# Patient Record
Sex: Male | Born: 1955
Health system: Southern US, Community
[De-identification: ages and names within clinical notes are randomized; demographics above are authoritative.]

## PROBLEM LIST (undated history)

## (undated) DIAGNOSIS — I10 Essential (primary) hypertension: Secondary | ICD-10-CM

## (undated) DIAGNOSIS — N401 Enlarged prostate with lower urinary tract symptoms: Secondary | ICD-10-CM

## (undated) DIAGNOSIS — M48061 Spinal stenosis, lumbar region without neurogenic claudication: Secondary | ICD-10-CM

## (undated) DIAGNOSIS — R202 Paresthesia of skin: Secondary | ICD-10-CM

## (undated) DIAGNOSIS — F329 Major depressive disorder, single episode, unspecified: Secondary | ICD-10-CM

## (undated) DIAGNOSIS — E785 Hyperlipidemia, unspecified: Secondary | ICD-10-CM

## (undated) DIAGNOSIS — M542 Cervicalgia: Secondary | ICD-10-CM

## (undated) DIAGNOSIS — K219 Gastro-esophageal reflux disease without esophagitis: Secondary | ICD-10-CM

## (undated) DIAGNOSIS — F419 Anxiety disorder, unspecified: Secondary | ICD-10-CM

## (undated) DIAGNOSIS — I2510 Atherosclerotic heart disease of native coronary artery without angina pectoris: Secondary | ICD-10-CM

## (undated) DIAGNOSIS — R062 Wheezing: Secondary | ICD-10-CM

## (undated) DIAGNOSIS — M79601 Pain in right arm: Secondary | ICD-10-CM

## (undated) DIAGNOSIS — I25118 Atherosclerotic heart disease of native coronary artery with other forms of angina pectoris: Secondary | ICD-10-CM

## (undated) DIAGNOSIS — Z95820 Peripheral vascular angioplasty status with implants and grafts: Secondary | ICD-10-CM

## (undated) DIAGNOSIS — M79602 Pain in left arm: Secondary | ICD-10-CM

## (undated) DIAGNOSIS — Z87442 Personal history of urinary calculi: Secondary | ICD-10-CM

## (undated) DIAGNOSIS — R079 Chest pain, unspecified: Secondary | ICD-10-CM

## (undated) DIAGNOSIS — E119 Type 2 diabetes mellitus without complications: Secondary | ICD-10-CM

## (undated) HISTORY — PX: HERNIA REPAIR: SHX51

## (undated) HISTORY — DX: Gastro-esophageal reflux disease without esophagitis: K21.9

## (undated) HISTORY — DX: Anxiety disorder, unspecified: F41.9

## (undated) HISTORY — DX: Type 2 diabetes mellitus without complications: E11.9

## (undated) HISTORY — DX: Spinal stenosis, lumbar region without neurogenic claudication: M48.061

## (undated) HISTORY — DX: Chest pain, unspecified: R07.9

## (undated) HISTORY — DX: Major depressive disorder, single episode, unspecified: F32.9

## (undated) HISTORY — DX: Hyperlipidemia, unspecified: E78.5

## (undated) HISTORY — DX: Paresthesia of skin: R20.2

## (undated) HISTORY — DX: Atherosclerotic heart disease of native coronary artery with other forms of angina pectoris: I25.118

## (undated) HISTORY — DX: Wheezing: R06.2

## (undated) HISTORY — PX: BACK SURGERY: SHX140

## (undated) HISTORY — DX: Essential (primary) hypertension: I10

## (undated) HISTORY — DX: Cervicalgia: M54.2

## (undated) HISTORY — DX: Benign prostatic hyperplasia with lower urinary tract symptoms: N40.1

## (undated) HISTORY — DX: Pain in right arm: M79.601

## (undated) HISTORY — PX: LITHOTRIPSY: SUR834

## (undated) HISTORY — DX: Pain in left arm: M79.602

---

## 1998-02-20 ENCOUNTER — Inpatient Hospital Stay (HOSPITAL_COMMUNITY): Admission: EM | Admit: 1998-02-20 | Discharge: 1998-02-21 | Payer: Self-pay | Admitting: *Deleted

## 1998-02-26 ENCOUNTER — Ambulatory Visit (HOSPITAL_COMMUNITY): Admission: RE | Admit: 1998-02-26 | Discharge: 1998-02-26 | Payer: Self-pay | Admitting: Cardiology

## 2005-03-01 ENCOUNTER — Ambulatory Visit: Payer: Self-pay | Admitting: Family Medicine

## 2005-09-22 ENCOUNTER — Ambulatory Visit: Payer: Self-pay | Admitting: Family Medicine

## 2005-12-20 ENCOUNTER — Ambulatory Visit: Payer: Self-pay | Admitting: Family Medicine

## 2006-01-12 ENCOUNTER — Ambulatory Visit: Payer: Self-pay | Admitting: Family Medicine

## 2010-11-09 ENCOUNTER — Encounter
Admission: RE | Admit: 2010-11-09 | Discharge: 2010-11-09 | Payer: Self-pay | Source: Home / Self Care | Attending: Orthopedic Surgery | Admitting: Orthopedic Surgery

## 2010-12-27 ENCOUNTER — Ambulatory Visit (HOSPITAL_COMMUNITY)
Admission: RE | Admit: 2010-12-27 | Discharge: 2010-12-27 | Disposition: A | Discharge status: Home / Self Care | Payer: Worker's Compensation | Source: Ambulatory Visit | Attending: Orthopedic Surgery | Admitting: Orthopedic Surgery

## 2010-12-27 ENCOUNTER — Other Ambulatory Visit: Payer: Self-pay | Admitting: Orthopedic Surgery

## 2010-12-27 ENCOUNTER — Other Ambulatory Visit (HOSPITAL_COMMUNITY): Payer: Self-pay | Admitting: Orthopedic Surgery

## 2010-12-27 ENCOUNTER — Encounter (HOSPITAL_COMMUNITY): Payer: Worker's Compensation

## 2010-12-27 DIAGNOSIS — Z01818 Encounter for other preprocedural examination: Secondary | ICD-10-CM | POA: Insufficient documentation

## 2010-12-27 DIAGNOSIS — M545 Low back pain, unspecified: Secondary | ICD-10-CM | POA: Insufficient documentation

## 2010-12-27 LAB — DIFFERENTIAL
Basophils Absolute: 0.1 10*3/uL (ref 0.0–0.1)
Basophils Relative: 1 % (ref 0–1)
Neutro Abs: 3.5 10*3/uL (ref 1.7–7.7)
Neutrophils Relative %: 50 % (ref 43–77)

## 2010-12-27 LAB — CBC
Hemoglobin: 13.3 g/dL (ref 13.0–17.0)
RBC: 4.44 MIL/uL (ref 4.22–5.81)
WBC: 6.8 10*3/uL (ref 4.0–10.5)

## 2010-12-27 LAB — URINALYSIS, ROUTINE W REFLEX MICROSCOPIC
Bilirubin Urine: NEGATIVE
Nitrite: NEGATIVE
Protein, ur: NEGATIVE mg/dL
Urine Glucose, Fasting: NEGATIVE mg/dL
Urobilinogen, UA: 0.2 mg/dL (ref 0.0–1.0)
pH: 5.5 (ref 5.0–8.0)

## 2010-12-27 LAB — COMPREHENSIVE METABOLIC PANEL
ALT: 46 U/L (ref 0–53)
AST: 42 U/L — ABNORMAL HIGH (ref 0–37)
Calcium: 9.6 mg/dL (ref 8.4–10.5)
GFR calc Af Amer: >60 mL/min (ref >60)
Glucose, Bld: 109 mg/dL — ABNORMAL HIGH (ref 70–99)
Sodium: 139 mEq/L (ref 135–145)
Total Protein: 7.4 g/dL (ref 6.0–8.3)

## 2010-12-27 LAB — PROTIME-INR
INR: 0.96 (ref 0.00–1.49)
Prothrombin Time: 13 seconds (ref 11.6–15.2)

## 2010-12-27 LAB — SURGICAL PCR SCREEN
MRSA, PCR: NEGATIVE
Staphylococcus aureus: NEGATIVE

## 2010-12-27 LAB — URINE MICROSCOPIC-ADD ON

## 2010-12-27 LAB — ABO/RH: ABO/RH(D): O NEG

## 2010-12-27 LAB — TYPE AND SCREEN: Antibody Screen: NEGATIVE

## 2010-12-29 ENCOUNTER — Inpatient Hospital Stay (HOSPITAL_COMMUNITY)
Admission: RE | Admit: 2010-12-29 | Discharge: 2011-01-01 | DRG: 460 | Disposition: A | Discharge status: Home / Self Care | Payer: Worker's Compensation | Source: Ambulatory Visit | Attending: Orthopedic Surgery | Admitting: Orthopedic Surgery

## 2010-12-29 ENCOUNTER — Inpatient Hospital Stay (HOSPITAL_COMMUNITY): Payer: Worker's Compensation

## 2010-12-29 DIAGNOSIS — M5137 Other intervertebral disc degeneration, lumbosacral region: Principal | ICD-10-CM | POA: Diagnosis present

## 2010-12-29 DIAGNOSIS — M51379 Other intervertebral disc degeneration, lumbosacral region without mention of lumbar back pain or lower extremity pain: Principal | ICD-10-CM | POA: Diagnosis present

## 2010-12-29 DIAGNOSIS — M5126 Other intervertebral disc displacement, lumbar region: Secondary | ICD-10-CM | POA: Diagnosis present

## 2010-12-30 LAB — CARDIAC PANEL(CRET KIN+CKTOT+MB+TROPI)
CK, MB: 26.5 ng/mL (ref 0.3–4.0)
Relative Index: 0.2 (ref 0.0–2.5)
Total CK: 18309 U/L — ABNORMAL HIGH (ref 7–232)
Troponin I: 0.02 ng/mL (ref 0.00–0.06)

## 2010-12-30 LAB — POCT I-STAT 4, (NA,K, GLUC, HGB,HCT)
Potassium: 4.9 mEq/L (ref 3.5–5.1)
Sodium: 137 mEq/L (ref 135–145)

## 2010-12-30 LAB — CBC
HCT: 33 % — ABNORMAL LOW (ref 39.0–52.0)
Hemoglobin: 11.1 g/dL — ABNORMAL LOW (ref 13.0–17.0)
MCH: 30.7 pg (ref 26.0–34.0)
MCHC: 33.6 g/dL (ref 30.0–36.0)
MCV: 91.4 fL (ref 78.0–100.0)

## 2011-01-02 NOTE — Op Note (Addendum)
NAMELUMAN, HOLWAY NO.:  [id number]  MEDICAL RECORD NO.:  [id number]           PATIENT TYPE:  LOCATION:                                 FACILITY:  PHYSICIAN:  Estill Bamberg, MD      DATE OF BIRTH:  Dec 17, 1955  DATE OF PROCEDURE:  12/29/2010 DATE OF DISCHARGE:                              OPERATIVE REPORT   PREOPERATIVE DIAGNOSES: 1. Degenerative disk disease L4-5, L5-S1. 2. Right-sided S1 radiculopathy. 3. Right-sided L5-S1 herniated nucleus pulposus.  POSTOPERATIVE DIAGNOSES: 1. Degenerative disk disease L4-5, L5-S1. 2. Right-sided S1 radiculopathy. 3. Right-sided L5-S1 herniated nucleus pulposus.  PROCEDURE: 1. Posterolateral lumbar fusion L4-5, L5-S1. 2. Transforaminal lumbar interbody fusion L4-5, L5-S1. 3. Placement of posterior segmental instrumentation L4, L5, S1. 4. Insertion of intervertebral spacer L4-5, L5-S1 (11-mm Concorde     curved parallel cage at L4-5, 10-mm Concorde bullet cage L5-S1). 5. Use of local autograft. 6. Intraoperative bone marrow aspiration. 7. Gill type laminectomy L4-5, L5-S1. 8. Intraoperative use of fluoroscopy.  SURGEON:  Estill Bamberg, MD  ASSISTANT:  Dan Humphreys, [facility]  ANESTHESIA:  General endotracheal anesthesia.  ESTIMATED BLOOD LOSS:  1100 mL with 400 mL of Cell Saver blood autotransfused.  COMPLICATIONS:  None.  DISPOSITION:  Stable.  INDICATIONS FOR PROCEDURE:  Briefly, Mr. Olivero is a very pleasant 55- year-old male who I initially saw in my office on October 05, 2010 with low back and right leg pain.  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  He immediately went onto note pain in his low back.  He then went onto have pain in his right leg as well as increasing pain in his low back.  We did go forward with extensive conservative treatment measures including anti-inflammatory medications, pain  medications, physical therapy in addition to steroids.  The patient, however, did go onto have severe debilitating pain in his low back.  A diskogram was obtained and was consistent with concordant pain at the L4-5 and L5-S1 levels.  These were the levels which did correlate to his MRI findings which did reveal degenerative disk disease at the levels mentioned above.  The patient did understand the risks and limitations of surgery.  Specifically, the patient understand there is a 70% chance of alleviation of his pain.  After fully understanding the risks and limitations of surgery, the patient did agree to go forward with the procedure outlined above.  OPERATIVE DETAILS:  On December 29, 2010, the patient was brought to surgery and general endotracheal anesthesia was administered.  The patient was placed prone on a well-padded Jackson spinal frame.  The back was prepped and draped in the usual sterile fashion.  SCDs were placed and a time-out procedure was performed.  Ancef was given for antibiotic prophylaxis.  A lateral preoperative radiograph with 18-gauge spinal needles into the back did help optimize the location of my incision.  I then made a midline incision from approximately the spinous process of L3 to approximately spinous process of S1.  I  did readily identified the fascia and the fascia was incised at the midline.  The paraspinal musculature was swept laterally and self-retaining retractors were placed.  The lamina of L4, L5, and S1 were subperiosteally exposed. At this point, I did bring in the C-arm and did obtain a lateral intraoperative fluoroscopic view in order to help confirm the proper levels.  I then exposed the transverse processes of L4 and L5 and sacral ala of S1 both the right and left sides.  I then placed screws into the L4, L5, and S1 pedicles in the usual manner.  The screws were placed using a 4-mm bur followed by a curved gearshift probe.  A 6-mm tap  was used at L4 and 7-mm tap was used at L5 and S1.  A ball-tip probe did confirm there was no cortical violation.  Bone wax was used to seal the holes.  This was done on both the right and left sides.  I then packed off the left side and turned my attention towards the right side.  I did perform a right-sided Gill type laminectomy on the right side removing the inferior articular process of L4.  I then removed the superior articular process of L5 and ligamentum flavum was removed uneventfully. I did safely retract the traversing L5 nerve medially and the exiting L4 nerve superiorly.  At this point, I did readily identify the intervertebral space.  The posterolateral aspect of the disk was incised using a 15 blade scalpel.  I then used a series of pituitaries, curettes, and Kerrison's in order to perform a thorough diskectomy at the L4-5 level.  I then went forward with a series of trials and I did feel that 11-mm Concorde curved cage would be the appropriate fit.  At this point I went forward with bone marrow aspiration.  I used a #15 blade knife to make a stab incision over the left iliac crest.  10 mL of bone marrow aspirate was obtained and was mixed with 10 mL pack of Vitoss BA.  At this point, I did pack the intervertebral space with an extensive amount of autologous bone that was obtained from the decompression.  I then filled the intervertebral cage with autologous bone and Vitoss.  The cage was tamped into position in the usual fashion.  I did note an excellent press fit.  I did use AP and lateral fluoroscopic views to help optimize location of the intervertebral cage. I then turned my attention towards the L5-S1 intervertebral space.  A diskectomy was performed in the manner previously described.  It should be noted that access to the L5-S1 intervertebral space was extremely difficult given the patient's excessive lordosis.  The L5 nerve, however, was gently retracted superiorly  in order to help gain access to the intervertebral space but again, it was extremely difficult to gain access to the space given the patient's lordosis.  I was, however, able to place a 10-mm Concorde bullet cage trial and I did feel that this trial was to be the appropriate fit.  The intervertebral space was again packed with autograft bone and the intervertebral cage was also packed with allograft bone in addition to Vitoss.  The cage was tamped into position in the usual fashion.  At this point, I did place the L4, the L5, and S1 screws on the right side.  On the right side, 7 x 45 mm screws were placed at L4, and 8 x 45-mm screw was placed at L5 and an 8  x 45 mm screw was placed at S1.  Of note, the S1 screws were placed under live fluoroscopy in order to help optimize trajectory of the screw.  An appropriately sized 5.5-mm rod was placed in the screw heads and caps were placed.  Just prior to final tightening procedure, I did slightly compress and crossed the intervertebral spaces at L4-5 and L5- S1.  Of note, I did perform motor evoked EMG potentials with a neurologic monitoring team and there was no motor evoked EMG potential less than 25 mA.  Again, I did obtain AP and lateral views in order to confirm the appropriate location of the screws.  At this point, I copiously irrigated the wound with approximately 1 liter of normal saline and I then turned my attention towards the patient's left side. I did go forward with a posterolateral fusion.  To do this, I did use a 4-mm high-speed bur and I did decorticate the posterior elements of L4, L5, and S1 and I did decorticate the transverse process of L4 and L5 and the sacral ala of S1.  An additional 10 mL of Vitoss BA was obtained and bone marrow aspirate was again obtained.  The remainder of the autograft was mixed with the Vitoss BA and bone marrow aspirate mixture.  This was packed onto the posterior elements and across the  posterolateral gutters.  The L4, the L5, and S1 screws were placed in the manner described previously.  The same size as previously described were used with the exception of the L4 screw on the left side, in which a 7 x 40 mm screw was placed.  I did again perform motor evoked EMG potentials at each screw and there was no motor evoked EMG potential less than 25. The appropriately sized rod was placed and caps were then placed and final tightened into position uneventfully.  Again, AP and lateral views were obtained and I was very happy with the appearance of the final construct.  Of note, all epidural bleeding was meticulously controlled. A Blake drain was placed across the right posterior elements.  Of note, there was no extravasation of cerebrospinal fluid encountered throughout the procedure.  There was a very small area of attenuation on the right side at the level of L5-S1 interspace.  I therefore did place a small patch of Duragen and this was reinforced with DuraSeal.  Again, no extravasation of cerebrospinal fluid was encountered.  I then closed the fascia using #1 Vicryl.  Subcutaneous layer was closed using 2-0 Vicryl and the skin was closed using 3-0 Monocryl.  All instrument counts were correct at the termination of the procedure.  Of note, Skip Mayer, PA-C, was my assistant throughout the procedure and aided in retraction and suctioning that was required throughout the procedure.     Estill Bamberg, MD     MD/MEDQ  D:  12/29/2010  T:  12/30/2010  Job:  [id number]  Electronically Signed by Loraine Leriche Asalee Barrette  on 12/31/2010 08:18:25 AM

## 2011-02-09 NOTE — Discharge Summary (Signed)
  NAMELANGLEY, INGALLS               ACCOUNT NO.:  [id number]  MEDICAL RECORD NO.:  [id number]           PATIENT TYPE:  I  LOCATION:  5037                         FACILITY:  [facility]  PHYSICIAN:  Estill Bamberg, MD      DATE OF BIRTH:  1956/02/23  DATE OF ADMISSION:  12/29/2010 DATE OF DISCHARGE:  01/01/2011                              DISCHARGE SUMMARY   ADMISSION DIAGNOSES: 1. Degenerative disk disease L4-5, L5-S1. 2. Right-sided S1 radiculopathy. 3. Right-sided L5-S1 disk herniation.  DISCHARGE DIAGNOSES: 1. Degenerative disk disease L4-5, L5-S1. 2. Right-sided S1 radiculopathy. 3. Right-sided L5-S1 disk herniation. 4. Status post anterior/posterior lumbar fusion L4-5, L5-S1 with     instrumentation.  ADMISSION HISTORY:  Briefly, Mr. Mangino is a pleasant 55 year old male who I evaluated in my office with signs and symptoms related to low back and right leg pain.  Of note, the patient was status post a work-related injury dated June 09, 2010.  The patient did go forward with extensive conservative treatment but continued to have debilitating pain.  The diskogram was significant for concordant pain at the L4-5 and L5-S1 levels, and therefore, the patient was admitted on December 29, 2010, to the hospital for operative intervention of his degenerative disk disease.  HOSPITAL COURSE:  On December 23, 2010, the patient was admitted for the procedure outlined above.  The patient tolerated the procedure well and was transferred to recovery in stable condition.  Initially, a Foley catheter was placed.  A PCA was started immediately after surgery.  On postop day #1, his Foley catheter was removed, as was his PCA.  The patient did get physical therapy throughout his hospital stay.  On postop day #3, it was felt by the physical therapy team that the patient will be safe for discharge home.  Of note, the patient's neurovascular exam was unremarkable and normal throughout his hospital stay.   His wound was noted to be clean, dry, and intact, and the patient was uneventfully discharged on postop day #3.  DISCHARGE INSTRUCTIONS:  The patient will take Vicodin for pain and Valium for muscle spasms.  He was not educated on back precautions.  The patient will follow up with me in approximately 2 weeks after his procedure.     Estill Bamberg, MD     MD/MEDQ  D:  01/27/2011  T:  01/28/2011  Job:  [id number]  Electronically Signed by Loraine Leriche Arabelle Bollig  on 02/09/2011 08:29:27 AM

## 2011-09-14 ENCOUNTER — Ambulatory Visit (HOSPITAL_COMMUNITY)
Admission: RE | Admit: 2011-09-14 | Discharge: 2011-09-14 | Disposition: A | Discharge status: Home / Self Care | Payer: Worker's Compensation | Source: Ambulatory Visit | Attending: Orthopedic Surgery | Admitting: Orthopedic Surgery

## 2011-09-14 ENCOUNTER — Ambulatory Visit (HOSPITAL_COMMUNITY): Payer: Worker's Compensation

## 2011-09-14 DIAGNOSIS — E119 Type 2 diabetes mellitus without complications: Secondary | ICD-10-CM | POA: Insufficient documentation

## 2011-09-14 DIAGNOSIS — I10 Essential (primary) hypertension: Secondary | ICD-10-CM | POA: Insufficient documentation

## 2011-09-14 DIAGNOSIS — M533 Sacrococcygeal disorders, not elsewhere classified: Secondary | ICD-10-CM | POA: Insufficient documentation

## 2011-09-14 DIAGNOSIS — K219 Gastro-esophageal reflux disease without esophagitis: Secondary | ICD-10-CM | POA: Insufficient documentation

## 2011-09-14 LAB — BASIC METABOLIC PANEL
CO2: 25 mEq/L (ref 19–32)
Chloride: 102 mEq/L (ref 96–112)
Glucose, Bld: 112 mg/dL — ABNORMAL HIGH (ref 70–99)
Sodium: 140 mEq/L (ref 135–145)

## 2011-09-14 LAB — CBC
Hemoglobin: 13.4 g/dL (ref 13.0–17.0)
MCV: 89.2 fL (ref 78.0–100.0)
Platelets: 227 10*3/uL (ref 150–400)
RBC: 4.44 MIL/uL (ref 4.22–5.81)
WBC: 7.4 10*3/uL (ref 4.0–10.5)

## 2011-09-14 LAB — DIFFERENTIAL
Basophils Relative: 0 % (ref 0–1)
Eosinophils Absolute: 0.1 10*3/uL (ref 0.0–0.7)
Lymphs Abs: 2.7 10*3/uL (ref 0.7–4.0)
Neutro Abs: 3.9 10*3/uL (ref 1.7–7.7)
Neutrophils Relative %: 53 % (ref 43–77)

## 2011-09-14 LAB — GLUCOSE, CAPILLARY
Glucose-Capillary: 111 mg/dL — ABNORMAL HIGH (ref 70–99)
Glucose-Capillary: 111 mg/dL — ABNORMAL HIGH (ref 70–99)

## 2011-09-14 LAB — TYPE AND SCREEN
ABO/RH(D): O NEG
Antibody Screen: NEGATIVE

## 2011-09-14 LAB — SURGICAL PCR SCREEN: MRSA, PCR: NEGATIVE

## 2011-09-16 NOTE — Op Note (Signed)
Nicholas Joseph, Nicholas Joseph NO.:  [id number]  MEDICAL RECORD NO.:  [id number]  LOCATION:  [facility]                         FACILITY:  [facility]  PHYSICIAN:  Estill Bamberg, MD      DATE OF BIRTH:  11-Jan-1956  DATE OF PROCEDURE:  09/14/2011 DATE OF DISCHARGE:  09/14/2011                              OPERATIVE REPORT   PREOPERATIVE DIAGNOSIS:  Right sacroiliac joint dysfunction.  POSTOP DIAGNOSIS:  Right sacroiliac joint dysfunction.  PROCEDURE:  Right-sided sacroiliac joint fusion.  SURGEON:  Estill Bamberg, MD  ASSISTANT:  Nicholas Joseph, OPA  ANESTHESIA:  General endotracheal anesthesia.  COMPLICATIONS:  None.  DISPOSITION:  Stable.  ESTIMATED BLOOD LOSS:  Minimal.  IMPLANTS USED:  SI bone titanium implants, 7 mm in diameter (55, 40, 35 mm).  INDICATIONS FOR PROCEDURE:  Briefly, Nicholas Joseph is a very pleasant 55- year-old male who has been followed by me for various issues related to his lower back.  He did most recently go on to have pain at the right side of his low back.  I did feel this was secondary to sacroiliac joint dysfunction.  I did refer the patient to Dr. Modesto Charon for right-sided sacroiliac joint injection.  The patient had a total of 2 injections. On the first injection, the patient states that his pain was entirely alleviated, but the relief was only temporary.  He did not get significant improvement after second injection but he is clear in stating that this first injection did entirely alleviate his pain.  We did go forward other conservative measures as well.  Given his failure of conservative care and excellent response from his first SI injection, we did have discussion regarding going forward with the right-sided sacroiliac joint fusion.  The patient fully understood the risks and limitations of the procedure as outlined in my preoperative note.  OPERATIVE DETAILS:  On September 14, 2011, the patient was brought to the surgery and general  endotracheal anesthesia was administered.  The patient was placed prone on a well-padded flat Jackson table with gel rolls placed under the patient's chest and hips.  SCDs were placed and antibiotics were given.  The right buttock was prepped and draped in the usual sterile fashion.  A time-out procedure was performed.  I then brought in lateral fluoroscopy.  I did Marysue Fait out the sacral ala and the posterior border of the sacrum.  A 3 cm incision was made 1 cm posterior to the posterior border of the sacrum just beneath the sacral ala.  I then placed my first guidewire.  The guidewire was placed above the S1 foramen and clearly within the vertebral body as noted on inlet and outlet radiographs.  I did liberally use lateral inlet and outlet radiographs during advancement of the guidewire.  I then used a cannulated drill to drill across the sacroiliac joint.  I then used the broach and I placed a 55 mm titanium implant across the sacroiliac joint.  I did note an excellent press fit.  I then placed a second guidewire just beneath the first.  Again, liberal fluoroscopy was used to ensure the guidewires in the appropriate position.  The second guidewire was aimed towards  the neural foramen on the right side.  I then used the broach and a tamp in the manner described previously.  I then selected a 40 mm titanium implant and this was advanced across the sacroiliac joint in the same way the first was.  I did note an excellent press fit.  A 30 implant was placed below the second in the manner described previously.  A 35 mm implant was used at the inferior aspect of the sacroiliac joint.  I then again obtained AP lateral inlet and outlet views and was very happy with the final appearance of the radiographs.  The wound was then copiously irrigated.  The fascia was then closed using 0 Vicryl.  The subcutaneous layer was closed using 2-0 Vicryl, and the skin was closed using 3-0 Monocryl.  All  instrument counts were correct at the termination of the procedure.  Of note, Nicholas Joseph, was my assistant throughout the procedure and aided in essential suctioning and placement of the hardware.     Estill Bamberg, MD     MD/MEDQ  D:  09/14/2011  T:  09/15/2011  Job:  [id number]  Electronically Signed by Estill Bamberg  on 09/16/2011 05:33:53 PM

## 2015-11-24 DIAGNOSIS — N529 Male erectile dysfunction, unspecified: Secondary | ICD-10-CM | POA: Diagnosis not present

## 2015-11-24 DIAGNOSIS — J069 Acute upper respiratory infection, unspecified: Secondary | ICD-10-CM | POA: Diagnosis not present

## 2015-11-24 DIAGNOSIS — F339 Major depressive disorder, recurrent, unspecified: Secondary | ICD-10-CM | POA: Diagnosis not present

## 2016-01-20 DIAGNOSIS — L03031 Cellulitis of right toe: Secondary | ICD-10-CM | POA: Diagnosis not present

## 2016-01-22 DIAGNOSIS — N401 Enlarged prostate with lower urinary tract symptoms: Secondary | ICD-10-CM | POA: Insufficient documentation

## 2016-01-22 DIAGNOSIS — F419 Anxiety disorder, unspecified: Secondary | ICD-10-CM

## 2016-01-22 DIAGNOSIS — E785 Hyperlipidemia, unspecified: Secondary | ICD-10-CM

## 2016-01-22 DIAGNOSIS — Z794 Long term (current) use of insulin: Secondary | ICD-10-CM | POA: Insufficient documentation

## 2016-01-22 DIAGNOSIS — I10 Essential (primary) hypertension: Secondary | ICD-10-CM | POA: Insufficient documentation

## 2016-01-22 DIAGNOSIS — F329 Major depressive disorder, single episode, unspecified: Secondary | ICD-10-CM | POA: Insufficient documentation

## 2016-01-22 DIAGNOSIS — F32A Depression, unspecified: Secondary | ICD-10-CM | POA: Insufficient documentation

## 2016-01-22 DIAGNOSIS — E1165 Type 2 diabetes mellitus with hyperglycemia: Secondary | ICD-10-CM

## 2016-01-22 DIAGNOSIS — K219 Gastro-esophageal reflux disease without esophagitis: Secondary | ICD-10-CM | POA: Insufficient documentation

## 2016-01-22 DIAGNOSIS — M48061 Spinal stenosis, lumbar region without neurogenic claudication: Secondary | ICD-10-CM

## 2016-01-22 DIAGNOSIS — E119 Type 2 diabetes mellitus without complications: Secondary | ICD-10-CM

## 2016-01-22 HISTORY — DX: Gastro-esophageal reflux disease without esophagitis: K21.9

## 2016-01-22 HISTORY — DX: Type 2 diabetes mellitus without complications: E11.9

## 2016-01-22 HISTORY — DX: Depression, unspecified: F32.A

## 2016-01-22 HISTORY — DX: Essential (primary) hypertension: I10

## 2016-01-22 HISTORY — DX: Hyperlipidemia, unspecified: E78.5

## 2016-01-22 HISTORY — DX: Anxiety disorder, unspecified: F41.9

## 2016-01-22 HISTORY — DX: Spinal stenosis, lumbar region without neurogenic claudication: M48.061

## 2016-01-22 HISTORY — DX: Type 2 diabetes mellitus with hyperglycemia: E11.65

## 2016-01-22 HISTORY — DX: Benign prostatic hyperplasia with lower urinary tract symptoms: N40.1

## 2016-03-07 DIAGNOSIS — E1169 Type 2 diabetes mellitus with other specified complication: Secondary | ICD-10-CM | POA: Diagnosis not present

## 2016-03-07 DIAGNOSIS — E782 Mixed hyperlipidemia: Secondary | ICD-10-CM | POA: Diagnosis not present

## 2016-03-07 DIAGNOSIS — I10 Essential (primary) hypertension: Secondary | ICD-10-CM | POA: Diagnosis not present

## 2016-03-07 DIAGNOSIS — Z1329 Encounter for screening for other suspected endocrine disorder: Secondary | ICD-10-CM | POA: Diagnosis not present

## 2016-03-08 DIAGNOSIS — E1169 Type 2 diabetes mellitus with other specified complication: Secondary | ICD-10-CM | POA: Diagnosis not present

## 2016-03-09 DIAGNOSIS — E119 Type 2 diabetes mellitus without complications: Secondary | ICD-10-CM | POA: Diagnosis not present

## 2016-03-09 DIAGNOSIS — M4806 Spinal stenosis, lumbar region: Secondary | ICD-10-CM | POA: Diagnosis not present

## 2016-03-09 DIAGNOSIS — N401 Enlarged prostate with lower urinary tract symptoms: Secondary | ICD-10-CM | POA: Diagnosis not present

## 2016-03-09 DIAGNOSIS — F419 Anxiety disorder, unspecified: Secondary | ICD-10-CM | POA: Diagnosis not present

## 2016-03-09 DIAGNOSIS — E785 Hyperlipidemia, unspecified: Secondary | ICD-10-CM | POA: Diagnosis not present

## 2016-03-09 DIAGNOSIS — I10 Essential (primary) hypertension: Secondary | ICD-10-CM | POA: Diagnosis not present

## 2016-03-09 DIAGNOSIS — K219 Gastro-esophageal reflux disease without esophagitis: Secondary | ICD-10-CM | POA: Diagnosis not present

## 2016-03-09 DIAGNOSIS — F329 Major depressive disorder, single episode, unspecified: Secondary | ICD-10-CM | POA: Diagnosis not present

## 2016-03-30 DIAGNOSIS — E119 Type 2 diabetes mellitus without complications: Secondary | ICD-10-CM | POA: Diagnosis not present

## 2016-03-30 DIAGNOSIS — H524 Presbyopia: Secondary | ICD-10-CM | POA: Diagnosis not present

## 2016-03-30 DIAGNOSIS — H40053 Ocular hypertension, bilateral: Secondary | ICD-10-CM | POA: Diagnosis not present

## 2016-03-30 DIAGNOSIS — H2513 Age-related nuclear cataract, bilateral: Secondary | ICD-10-CM | POA: Diagnosis not present

## 2016-07-08 DIAGNOSIS — E119 Type 2 diabetes mellitus without complications: Secondary | ICD-10-CM | POA: Diagnosis not present

## 2016-07-08 DIAGNOSIS — E785 Hyperlipidemia, unspecified: Secondary | ICD-10-CM | POA: Diagnosis not present

## 2016-07-08 DIAGNOSIS — Z1329 Encounter for screening for other suspected endocrine disorder: Secondary | ICD-10-CM | POA: Diagnosis not present

## 2016-07-27 DIAGNOSIS — E119 Type 2 diabetes mellitus without complications: Secondary | ICD-10-CM | POA: Diagnosis not present

## 2016-07-27 DIAGNOSIS — K219 Gastro-esophageal reflux disease without esophagitis: Secondary | ICD-10-CM | POA: Diagnosis not present

## 2016-07-27 DIAGNOSIS — N401 Enlarged prostate with lower urinary tract symptoms: Secondary | ICD-10-CM | POA: Diagnosis not present

## 2016-07-27 DIAGNOSIS — F329 Major depressive disorder, single episode, unspecified: Secondary | ICD-10-CM | POA: Diagnosis not present

## 2016-07-27 DIAGNOSIS — M4806 Spinal stenosis, lumbar region: Secondary | ICD-10-CM | POA: Diagnosis not present

## 2016-07-27 DIAGNOSIS — I10 Essential (primary) hypertension: Secondary | ICD-10-CM | POA: Diagnosis not present

## 2016-07-27 DIAGNOSIS — Z0000 Encounter for general adult medical examination without abnormal findings: Secondary | ICD-10-CM | POA: Diagnosis not present

## 2016-07-27 DIAGNOSIS — E785 Hyperlipidemia, unspecified: Secondary | ICD-10-CM | POA: Diagnosis not present

## 2016-07-27 DIAGNOSIS — F419 Anxiety disorder, unspecified: Secondary | ICD-10-CM | POA: Diagnosis not present

## 2016-11-23 DIAGNOSIS — Z1329 Encounter for screening for other suspected endocrine disorder: Secondary | ICD-10-CM | POA: Diagnosis not present

## 2016-11-23 DIAGNOSIS — E119 Type 2 diabetes mellitus without complications: Secondary | ICD-10-CM | POA: Diagnosis not present

## 2016-11-23 DIAGNOSIS — E785 Hyperlipidemia, unspecified: Secondary | ICD-10-CM | POA: Diagnosis not present

## 2016-11-28 DIAGNOSIS — M48061 Spinal stenosis, lumbar region without neurogenic claudication: Secondary | ICD-10-CM | POA: Diagnosis not present

## 2016-11-28 DIAGNOSIS — J309 Allergic rhinitis, unspecified: Secondary | ICD-10-CM | POA: Diagnosis not present

## 2016-11-28 DIAGNOSIS — I10 Essential (primary) hypertension: Secondary | ICD-10-CM | POA: Diagnosis not present

## 2016-11-28 DIAGNOSIS — F418 Other specified anxiety disorders: Secondary | ICD-10-CM | POA: Diagnosis not present

## 2016-11-28 DIAGNOSIS — K219 Gastro-esophageal reflux disease without esophagitis: Secondary | ICD-10-CM | POA: Diagnosis not present

## 2016-11-28 DIAGNOSIS — M5441 Lumbago with sciatica, right side: Secondary | ICD-10-CM | POA: Diagnosis not present

## 2016-11-28 DIAGNOSIS — E785 Hyperlipidemia, unspecified: Secondary | ICD-10-CM | POA: Diagnosis not present

## 2016-11-28 DIAGNOSIS — N401 Enlarged prostate with lower urinary tract symptoms: Secondary | ICD-10-CM | POA: Diagnosis not present

## 2016-11-28 DIAGNOSIS — E119 Type 2 diabetes mellitus without complications: Secondary | ICD-10-CM | POA: Diagnosis not present

## 2016-11-28 DIAGNOSIS — G8929 Other chronic pain: Secondary | ICD-10-CM | POA: Diagnosis not present

## 2017-07-17 DIAGNOSIS — M79601 Pain in right arm: Secondary | ICD-10-CM

## 2017-07-17 DIAGNOSIS — R202 Paresthesia of skin: Secondary | ICD-10-CM

## 2017-07-17 DIAGNOSIS — M542 Cervicalgia: Secondary | ICD-10-CM | POA: Insufficient documentation

## 2017-07-17 DIAGNOSIS — M79602 Pain in left arm: Secondary | ICD-10-CM

## 2017-07-17 DIAGNOSIS — R062 Wheezing: Secondary | ICD-10-CM

## 2017-07-17 HISTORY — DX: Wheezing: R06.2

## 2017-07-17 HISTORY — DX: Pain in right arm: M79.601

## 2017-07-17 HISTORY — DX: Cervicalgia: M54.2

## 2017-07-17 HISTORY — DX: Pain in left arm: M79.602

## 2018-05-01 DIAGNOSIS — R079 Chest pain, unspecified: Secondary | ICD-10-CM

## 2018-05-01 HISTORY — DX: Chest pain, unspecified: R07.9

## 2018-06-11 DIAGNOSIS — R079 Chest pain, unspecified: Secondary | ICD-10-CM | POA: Insufficient documentation

## 2018-06-11 HISTORY — DX: Chest pain, unspecified: R07.9

## 2018-06-11 NOTE — Progress Notes (Signed)
Cardiology Office Note:    Date:  06/12/2018   ID:  Nicholas Joseph, DOB [birth year], MRN [id number]  PCP:  Georga Kaufmann, MD  Cardiologist:  Norman Herrlich, MD   Referring MD: Georga Kaufmann, MD  ASSESSMENT:    1. Chest pain in adult   2. Essential hypertension   3. Dyslipidemia   4. Type 2 diabetes mellitus with complication, with long-term current use of insulin (HCC)   5. Type 2 diabetes mellitus without complication, without long-term current use of insulin (HCC)    PLAN:    In order of problems listed above:  1. He is at high cardiovascular risk is having ischemic symptoms limiting exertional dyspnea and atypical angina will undergo further evaluation with a cardiac CTA.  I asked him to continue his current medical treatment prescribed nitroglycerin to use as needed and if he has flow-limiting stenosis would benefit from revascularization.  I chose not to do a myocardial perfusion study as his chronic back pain limits his ability on a treadmill and his risk is high and decision making here is complex cardiac CTA is a better match.   2. Stable blood pressure target continue current treatment beta-blocker thiazide diuretic And ACE inhibitor 3. Stable continue with statin 4. Stable managed by his PCP stable continue statin 5. Shortness of breath appears to be anginal equivalent, but also risk for heart failure with diabetes check BNP level  Next appointment 6 weeks   Medication Adjustments/Labs and Tests Ordered: Current medicines are reviewed at length with the patient today.  Concerns regarding medicines are outlined above.  Orders Placed This Encounter  Procedures  . CT CORONARY MORPH W/CTA COR W/SCORE W/CA W/CM &/OR WO/CM  . CT CORONARY FRACTIONAL FLOW RESERVE DATA PREP  . CT CORONARY FRACTIONAL FLOW RESERVE FLUID ANALYSIS  . Pro b natriuretic peptide (BNP)  . Basic metabolic panel  . EKG 12-Lead   Meds ordered this encounter  Medications  . nitroGLYCERIN  (NITROSTAT) 0.4 MG SL tablet    Sig: Place 1 tablet (0.4 mg total) under the tongue every 5 (five) minutes as needed for chest pain (CALL 911 WITH NO RELIEF IN CHEST PAIN AFTER 3 PILLS.).    Dispense:  30 tablet    Refill:  1  . metoprolol tartrate (LOPRESSOR) 50 MG tablet    Sig: Take 1 tablet (50 mg total) by mouth once for 1 dose.    Dispense:  1 tablet    Refill:  0     Chief Complaint  Patient presents with  . New Patient (Initial Visit)  . Chest Pain    History of Present Illness:    Nicholas Joseph is a 62 y.o. male with hypertension, hyperlipidemia and T2 DM who is being seen today for the evaluation of chest pain at the request of Corum, Leeanne Mannan, MD. Last 2 months he is at a stable progressive pattern of limiting exertional shortness of breath he has a stopped pause at rest when he climbs stairs or tries to do housework or gardening work.  He has had no cough wheezing no orthopnea or edema.  He has no history of underlying lung or cardiac disease.  He also has substernal pressure radiates through to his back in the left arm that occurs every day every other day tends to occur in the evenings he describes as heaviness and pressure nonpleuritic associated shortness of breath no diaphoresis mild in nature and resolve spontaneously in about 5 minutes.  He  has not had nocturnal episodes awakening him from his sleep.  He has had previous myocardial perfusion studies that he describes as normal.  Shortness of breath appears to be anginal equivalent  Past Medical History:  Diagnosis Date  . Anxiety and depression 01/22/2016  . Benign non-nodular prostatic hyperplasia with lower urinary tract symptoms 01/22/2016  . Chest pain 05/01/2018  . Chest pain in adult 06/11/2018  . Degenerative lumbar spinal stenosis 01/22/2016  . Dyslipidemia 01/22/2016  . Gastroesophageal reflux disease without esophagitis 01/22/2016  . Hypertension 01/22/2016  . Neck pain 07/17/2017  . Paresthesia and pain of both  upper extremities 07/17/2017  . Type 2 diabetes mellitus without complication, without long-term current use of insulin (HCC) 01/22/2016  . Wheezing on right side of chest on exhalation 07/17/2017    Past Surgical History:  Procedure Laterality Date  . BACK SURGERY    . HERNIA REPAIR    . LITHOTRIPSY      Current Medications: Current Meds  Medication Sig  . amLODipine (NORVASC) 10 MG tablet Take 10 mg by mouth daily.  Marland Kitchen. aspirin EC 81 MG tablet Take 81 mg by mouth daily.  Marland Kitchen. atorvastatin (LIPITOR) 40 MG tablet Take 40 mg by mouth daily.  . bisoprolol-hydrochlorothiazide (ZIAC) 5-6.25 MG tablet TAKE 1 TABLET BY MOUTH IN THE MORNING FOR BLOOD PRESSURE  . DULoxetine (CYMBALTA) 60 MG capsule Take 60 mg by mouth daily.  . fenofibrate 160 MG tablet Take 160 mg by mouth daily.  . fluticasone (FLONASE) 50 MCG/ACT nasal spray Place 2 sprays into both nostrils daily as needed.  Marland Kitchen. glipiZIDE (GLUCOTROL XL) 10 MG 24 hr tablet Take 1 tablet by mouth 2 (two) times daily.  Marland Kitchen. JANUVIA 50 MG tablet Take 50 mg by mouth daily.  Marland Kitchen. lisinopril (PRINIVIL,ZESTRIL) 40 MG tablet Take 40 mg by mouth daily.  Marland Kitchen. loratadine (CLARITIN) 10 MG tablet Take 10 mg by mouth daily.  . metFORMIN (GLUCOPHAGE) 1000 MG tablet TAKE 1 TABLET (1,000 MG TOTAL) BY MOUTH 2 TIMES DAILY WITH MEALS.  Marland Kitchen. pantoprazole (PROTONIX) 40 MG tablet TAKE 1 TABLET BY MOUTH EVERY MORNING BEFORE MEALS FOR REFLUX     Allergies:   Patient has no known allergies.   Social History   Socioeconomic History  . Marital status: Married    Spouse name: Not on file  . Number of children: Not on file  . Years of education: Not on file  . Highest education level: Not on file  Occupational History  . Not on file  Social Needs  . Financial resource strain: Not on file  . Food insecurity:    Worry: Not on file    Inability: Not on file  . Transportation needs:    Medical: Not on file    Non-medical: Not on file  Tobacco Use  . Smoking status: Former  Smoker    Types: Cigars  . Smokeless tobacco: Former Engineer, waterUser  Substance and Sexual Activity  . Alcohol use: Not Currently    Frequency: Never  . Drug use: Not Currently  . Sexual activity: Not on file  Lifestyle  . Physical activity:    Days per week: Not on file    Minutes per session: Not on file  . Stress: Not on file  Relationships  . Social connections:    Talks on phone: Not on file    Gets together: Not on file    Attends religious service: Not on file    Active member of club or organization: Not  on file    Attends meetings of clubs or organizations: Not on file    Relationship status: Not on file  Other Topics Concern  . Not on file  Social History Narrative  . Not on file     Family History: The patient's family history includes Diabetes in his maternal grandmother; Heart attack in his father; Hypertension in his father; Kidney failure in his mother.  ROS:   Review of Systems  HENT: Negative.   Eyes: Negative.   Cardiovascular: Positive for chest pain and dyspnea on exertion.  Respiratory: Positive for shortness of breath.   Endocrine: Negative.   Hematologic/Lymphatic: Negative.   Skin: Negative.   Musculoskeletal: Positive for back pain and joint pain.  Gastrointestinal: Negative.   Genitourinary: Negative.   Neurological: Negative.   Psychiatric/Behavioral: Negative.   Allergic/Immunologic: Negative.    Please see the history of present illness.     All other systems reviewed and are negative.  EKGs/Labs/Other Studies Reviewed:    The following studies were reviewed today: Sinus rhythm normal  EKG:  EKG is  ordered today.  The ekg ordered today demonstrates sinus rhythm normal  EKg 05/01/18 SRTH normal,   CMP normal except Glu 303, GFR 53 cc/nim, TSH normal Recent Labs: 05/01/18 A1C 7.2%  Recent Lipid Panel  Chol 167 HDL 38 LDL 102 nonHDL 129   Physical Exam:    VS:  BP 124/80 (BP Location: Left Arm, Patient Position: Sitting, Cuff Size:  Normal)   Pulse 66   Ht 5\' 6"  (1.676 m)   Wt 183 lb 6.4 oz (83.2 kg)   SpO2 98%   BMI 29.60 kg/m     Wt Readings from Last 3 Encounters:  06/12/18 183 lb 6.4 oz (83.2 kg)     GEN:  Well nourished, well developed in no acute distress HEENT: Normal NECK: No JVD; No carotid bruits LYMPHATICS: No lymphadenopathy CARDIAC: RRR, no murmurs, rubs, gallops RESPIRATORY:  Clear to auscultation without rales, wheezing or rhonchi  ABDOMEN: Soft, non-tender, non-distended MUSCULOSKELETAL:  No edema; No deformity  SKIN: Warm and dry NEUROLOGIC:  Alert and oriented x 3 PSYCHIATRIC:  Normal affect     Signed, Norman Herrlich, MD  06/12/2018 2:19 PM    [facility]

## 2018-06-12 ENCOUNTER — Encounter: Payer: Self-pay | Admitting: Cardiology

## 2018-06-12 ENCOUNTER — Ambulatory Visit: Payer: Medicare HMO | Admitting: Cardiology

## 2018-06-12 VITALS — BP 124/80 | HR 66 | Ht 66.0 in | Wt 183.4 lb

## 2018-06-12 DIAGNOSIS — E118 Type 2 diabetes mellitus with unspecified complications: Secondary | ICD-10-CM

## 2018-06-12 DIAGNOSIS — R079 Chest pain, unspecified: Secondary | ICD-10-CM

## 2018-06-12 DIAGNOSIS — I10 Essential (primary) hypertension: Secondary | ICD-10-CM

## 2018-06-12 DIAGNOSIS — E785 Hyperlipidemia, unspecified: Secondary | ICD-10-CM

## 2018-06-12 DIAGNOSIS — E119 Type 2 diabetes mellitus without complications: Secondary | ICD-10-CM

## 2018-06-12 DIAGNOSIS — Z794 Long term (current) use of insulin: Secondary | ICD-10-CM

## 2018-06-12 MED ORDER — NITROGLYCERIN 0.4 MG SL SUBL: 0.4000 mg | SUBLINGUAL_TABLET | SUBLINGUAL | 1 refills | Status: DC | PRN

## 2018-06-12 MED ORDER — METOPROLOL TARTRATE 50 MG PO TABS: 50.0000 mg | ORAL_TABLET | Freq: Once | ORAL | 0 refills | Status: DC

## 2018-06-12 NOTE — Patient Instructions (Addendum)
Medication Instructions:  The proper use and anticipated side effects of nitroglycerine has been carefully explained.  If a single episode of chest pain is not relieved by one tablet, the patient will try another within 5 minutes; and if this doesn't relieve the pain, the patient is instructed to call 911 for transportation to an emergency department.  Labwork: Your physician recommends that you have lab work today to check your Kidney function as well as sodium level and potassium and assess fluid level build up.  Testing/Procedures: Your physician has requested that you have cardiac CT. Cardiac computed tomography (CT) is a painless test that uses an x-ray machine to take clear, detailed pictures of your heart. For further information please visit [email or web address] Please follow instruction sheet as given.  [facility] at xx:xx AM (30-45 minutes prior to test start time)  [facility] [address] [place], [place] [address] [phone]  Proceed to the [facility] Radiology Department (First Floor).  Please follow these instructions carefully (unless otherwise directed):  Hold all erectile dysfunction medications at least 48 hours prior to test.  On the Night Before the Test: . Drink plenty of water. . Do not consume any caffeinated/decaffeinated beverages or chocolate 12 hours prior to your test. . Do not take any antihistamines 12 hours prior to your test. . If you take Metformin do not take 24 hours prior to test. . If the patient has contrast allergy: ? Patient will need a prescription for Prednisone and very clear instructions (as follows): 1. Prednisone 50 mg - take 13 hours prior to test 2. Take another Prednisone 50 mg 7 hours prior to test 3. Take another Prednisone 50 mg 1 hour prior to test 4. Take Benadryl 50 mg 1 hour prior to test . Patient must complete all four doses of above prophylactic  medications. . Patient will need a ride after test due to Benadryl.  On the Day of the Test: . Drink plenty of water. Do not drink any water within one hour of the test. . Do not eat any food 4 hours prior to the test. . You may take your regular medications prior to the test. . IF NOT ON A BETA BLOCKER - Take 50 mg of lopressor (metoprolol) one hour before the test. . HOLD Furosemide morning of the test.  After the Test: . Drink plenty of water. . After receiving IV contrast, you may experience a mild flushed feeling. This is normal. . On occasion, you may experience a mild rash up to 24 hours after the test. This is not dangerous. If this occurs, you can take Benadryl 25 mg and increase your fluid intake. . If you experience trouble breathing, this can be serious. If it is severe call 911 IMMEDIATELY. If it is mild, please call our office. . If you take any of these medications: Glipizide/Metformin, Avandament, Glucavance, please do not take 48 hours after completing test.    Follow-Up: Your physician recommends that you schedule a follow-up appointment in: 6 weeks   Any Other Special Instructions Will Be Listed Below (If Applicable).     If you need a refill on your cardiac medications before your next appointment, please call your pharmacy.

## 2018-06-13 LAB — BASIC METABOLIC PANEL
BUN / CREAT RATIO: 13 (ref 10–24)
BUN: 24 mg/dL (ref 8–27)
CHLORIDE: 98 mmol/L (ref 96–106)
CO2: 20 mmol/L (ref 20–29)
Calcium: 9.7 mg/dL (ref 8.6–10.2)
Creatinine, Ser: 1.78 mg/dL — ABNORMAL HIGH (ref 0.76–1.27)
GFR calc non Af Amer: 40 mL/min/{1.73_m2} — ABNORMAL LOW (ref >59)
GFR, EST AFRICAN AMERICAN: 46 mL/min/{1.73_m2} — AB (ref >59)
Glucose: 92 mg/dL (ref 65–99)
Potassium: 4.3 mmol/L (ref 3.5–5.2)
Sodium: 141 mmol/L (ref 134–144)

## 2018-06-13 LAB — PRO B NATRIURETIC PEPTIDE: NT-Pro BNP: 36 pg/mL (ref 0–210)

## 2018-06-27 DIAGNOSIS — H6690 Otitis media, unspecified, unspecified ear: Secondary | ICD-10-CM

## 2018-06-27 HISTORY — DX: Otitis media, unspecified, unspecified ear: H66.90

## 2018-07-03 ENCOUNTER — Ambulatory Visit (HOSPITAL_COMMUNITY)
Admission: RE | Admit: 2018-07-03 | Discharge: 2018-07-03 | Disposition: A | Discharge status: Home / Self Care | Payer: Medicare HMO | Source: Ambulatory Visit | Attending: Cardiology | Admitting: Cardiology

## 2018-07-03 DIAGNOSIS — R079 Chest pain, unspecified: Secondary | ICD-10-CM

## 2018-07-03 DIAGNOSIS — I2510 Atherosclerotic heart disease of native coronary artery without angina pectoris: Secondary | ICD-10-CM | POA: Diagnosis not present

## 2018-07-03 LAB — POCT I-STAT CREATININE: Creatinine, Ser: 1.4 mg/dL — ABNORMAL HIGH (ref 0.61–1.24)

## 2018-07-03 MED ORDER — NITROGLYCERIN 0.4 MG SL SUBL
0.8000 mg | SUBLINGUAL_TABLET | Freq: Once | SUBLINGUAL | Status: AC
  Administered 2018-07-03: 0.8 mg via SUBLINGUAL
  Filled 2018-07-03: qty 25

## 2018-07-03 MED ORDER — NITROGLYCERIN 0.4 MG SL SUBL
SUBLINGUAL_TABLET | SUBLINGUAL | Status: AC
  Filled 2018-07-03: qty 2

## 2018-07-03 MED ORDER — IOPAMIDOL (ISOVUE-370) INJECTION 76%
100.0000 mL | Freq: Once | INTRAVENOUS | Status: AC | PRN
  Administered 2018-07-03: 100 mL via INTRAVENOUS

## 2018-07-03 NOTE — Progress Notes (Signed)
CT scan completed. Tolerated well. D/C home walking. Awake and alert. In no distress. 

## 2018-07-05 NOTE — Progress Notes (Signed)
Cardiology Office Note:    Date:  07/06/2018   ID:  [name], DOB Apr 17, 1956, MRN [id number]  PCP:  Georga Kaufmann, MD  Cardiologist:  Norman Herrlich, MD    Referring MD: Georga Kaufmann, MD    ASSESSMENT:    1. Coronary artery disease of native artery of native heart with stable angina pectoris (HCC)   2. Essential hypertension   3. Dyslipidemia   4. Pre-op evaluation    PLAN:    In order of problems listed above:  1. Cardiac CTA shows flow-limiting proximal LAD stenosis he is continued angina with minor activity class III on 2 antianginal agents and is referred for coronary angiography as well as PCI and stent of appropriate. 2. Stable continue current treatment 3. Stable continue his statin   Next appointment: 4 weeks   Medication Adjustments/Labs and Tests Ordered: Current medicines are reviewed at length with the patient today.  Concerns regarding medicines are outlined above.  Orders Placed This Encounter  Procedures  . DG Chest 2 View  . CBC  . Basic Metabolic Panel (BMET)   No orders of the defined types were placed in this encounter.   Chief Complaint  Patient presents with  . Chest Pain    History of Present Illness:    Nicholas Joseph is a 62 y.o. male with a hx of exertional chest pain and SOB,hypertension, hyperlipidemia and T2 DM   last seen 06/12/18. Cardiac CTA showed severe proximal LAD stenosis FFR 0.65. Compliance with diet, lifestyle and medications: yes  He continues to have substernal chest pain tends to wax and wane with physical activity not severe but it causes him to stop and rest and now occurs daily and is displeased with the quality of his life.  He is taking 2 antianginal medications as flow-limiting stenosis in the proximal LAD and after review of benefits risk and options elects to undergo coronary angiography and indeed if he has flow-limiting stenosis PCI and stent.  He has mild renal insufficiency he will be prehydrated  he has no contraindication to dual antiplatelet therapy.  He is not having shortness of breath palpitations syncope or TIA.  There is no dye allergy. Past Medical History:  Diagnosis Date  . Anxiety and depression 01/22/2016  . Benign non-nodular prostatic hyperplasia with lower urinary tract symptoms 01/22/2016  . Chest pain 05/01/2018  . Chest pain in adult 06/11/2018  . Degenerative lumbar spinal stenosis 01/22/2016  . Dyslipidemia 01/22/2016  . Gastroesophageal reflux disease without esophagitis 01/22/2016  . Hypertension 01/22/2016  . Neck pain 07/17/2017  . Paresthesia and pain of both upper extremities 07/17/2017  . Type 2 diabetes mellitus without complication, without long-term current use of insulin (HCC) 01/22/2016  . Wheezing on right side of chest on exhalation 07/17/2017    Past Surgical History:  Procedure Laterality Date  . BACK SURGERY    . HERNIA REPAIR    . LITHOTRIPSY      Current Medications: Current Meds  Medication Sig  . amLODipine (NORVASC) 10 MG tablet Take 10 mg by mouth daily.  Marland Kitchen aspirin EC 81 MG tablet Take 81 mg by mouth daily.  Marland Kitchen atorvastatin (LIPITOR) 40 MG tablet Take 40 mg by mouth daily.  . bisoprolol-hydrochlorothiazide (ZIAC) 5-6.25 MG tablet TAKE 1 TABLET BY MOUTH IN THE MORNING FOR BLOOD PRESSURE  . DULoxetine (CYMBALTA) 60 MG capsule Take 60 mg by mouth daily.  . fenofibrate 160 MG tablet Take 160 mg by mouth daily.  Marland Kitchen  fluticasone (FLONASE) 50 MCG/ACT nasal spray Place 2 sprays into both nostrils daily as needed.  Marland Kitchen. glipiZIDE (GLUCOTROL XL) 10 MG 24 hr tablet Take 1 tablet by mouth 2 (two) times daily.  Marland Kitchen. JANUVIA 50 MG tablet Take 50 mg by mouth daily.  Marland Kitchen. lisinopril (PRINIVIL,ZESTRIL) 40 MG tablet Take 40 mg by mouth daily.  Marland Kitchen. loratadine (CLARITIN) 10 MG tablet Take 10 mg by mouth daily.  . metFORMIN (GLUCOPHAGE) 1000 MG tablet TAKE 1 TABLET (1,000 MG TOTAL) BY MOUTH 2 TIMES DAILY WITH MEALS.  . nitroGLYCERIN (NITROSTAT) 0.4 MG SL tablet Place 1 tablet (0.4  mg total) under the tongue every 5 (five) minutes as needed for chest pain (CALL 911 WITH NO RELIEF IN CHEST PAIN AFTER 3 PILLS.).  Marland Kitchen. pantoprazole (PROTONIX) 40 MG tablet TAKE 1 TABLET BY MOUTH EVERY MORNING BEFORE MEALS FOR REFLUX     Allergies:   Patient has no known allergies.   Social History   Socioeconomic History  . Marital status: Married    Spouse name: Not on file  . Number of children: Not on file  . Years of education: Not on file  . Highest education level: Not on file  Occupational History  . Not on file  Social Needs  . Financial resource strain: Not on file  . Food insecurity:    Worry: Not on file    Inability: Not on file  . Transportation needs:    Medical: Not on file    Non-medical: Not on file  Tobacco Use  . Smoking status: Former Smoker    Types: Cigars  . Smokeless tobacco: Former Engineer, waterUser  Substance and Sexual Activity  . Alcohol use: Not Currently    Frequency: Never  . Drug use: Not Currently  . Sexual activity: Not on file  Lifestyle  . Physical activity:    Days per week: Not on file    Minutes per session: Not on file  . Stress: Not on file  Relationships  . Social connections:    Talks on phone: Not on file    Gets together: Not on file    Attends religious service: Not on file    Active member of club or organization: Not on file    Attends meetings of clubs or organizations: Not on file    Relationship status: Not on file  Other Topics Concern  . Not on file  Social History Narrative  . Not on file     Family History: The patient's family history includes Diabetes in his maternal grandmother; Heart attack in his father; Hypertension in his father; Kidney failure in his mother. ROS:   Please see the history of present illness.    All other systems reviewed and are negative.  EKGs/Labs/Other Studies Reviewed:    The following studies were reviewed today:   06/12/2018: BUN 24; NT-Pro BNP 36; Potassium 4.3; Sodium 141 07/03/2018:  Creatinine, Ser 1.40  Recent Lipid Panel No results found for: CHOL, TRIG, HDL, CHOLHDL, VLDL, LDLCALC, LDLDIRECT  Physical Exam:    VS:  BP 136/74 (BP Location: Right Arm, Patient Position: Sitting, Cuff Size: Normal)   Pulse 67   Ht 5\' 6"  (1.676 m)   Wt 181 lb (82.1 kg)   SpO2 98%   BMI 29.21 kg/m     Wt Readings from Last 3 Encounters:  07/06/18 181 lb (82.1 kg)  06/12/18 183 lb 6.4 oz (83.2 kg)     GEN:  Well nourished, well developed in no acute  distress HEENT: Normal NECK: No JVD; No carotid bruits LYMPHATICS: No lymphadenopathy CARDIAC: RRR, no murmurs, rubs, gallops RESPIRATORY:  Clear to auscultation without rales, wheezing or rhonchi  ABDOMEN: Soft, non-tender, non-distended MUSCULOSKELETAL:  No edema; No deformity  SKIN: Warm and dry NEUROLOGIC:  Alert and oriented x 3 PSYCHIATRIC:  Normal affect    Signed, Norman HerrlichBrian Kenta Laster, MD  07/06/2018 11:00 AM    [facility]

## 2018-07-05 NOTE — H&P (View-Only) (Signed)
Cardiology Office Note:    Date:  07/06/2018   ID:  Nicholas Joseph, DOB 10/10/1956, MRN [id number]  PCP:  Corum, Lisa Leigh, MD  Cardiologist:  Brian Munley, MD    Referring MD: Corum, Lisa Leigh, MD    ASSESSMENT:    1. Coronary artery disease of native artery of native heart with stable angina pectoris (HCC)   2. Essential hypertension   3. Dyslipidemia   4. Pre-op evaluation    PLAN:    In order of problems listed above:  1. Cardiac CTA shows flow-limiting proximal LAD stenosis he is continued angina with minor activity class III on 2 antianginal agents and is referred for coronary angiography as well as PCI and stent of appropriate. 2. Stable continue current treatment 3. Stable continue his statin   Next appointment: 4 weeks   Medication Adjustments/Labs and Tests Ordered: Current medicines are reviewed at length with the patient today.  Concerns regarding medicines are outlined above.  Orders Placed This Encounter  Procedures  . DG Chest 2 View  . CBC  . Basic Metabolic Panel (BMET)   No orders of the defined types were placed in this encounter.   Chief Complaint  Patient presents with  . Chest Pain    History of Present Illness:    Nicholas Joseph is a 62 y.o. male with a hx of exertional chest pain and SOB,hypertension, hyperlipidemia and T2 DM   last seen 06/12/18. Cardiac CTA showed severe proximal LAD stenosis FFR 0.65. Compliance with diet, lifestyle and medications: yes  He continues to have substernal chest pain tends to wax and wane with physical activity not severe but it causes him to stop and rest and now occurs daily and is displeased with the quality of his life.  He is taking 2 antianginal medications as flow-limiting stenosis in the proximal LAD and after review of benefits risk and options elects to undergo coronary angiography and indeed if he has flow-limiting stenosis PCI and stent.  He has mild renal insufficiency he will be prehydrated  he has no contraindication to dual antiplatelet therapy.  He is not having shortness of breath palpitations syncope or TIA.  There is no dye allergy. Past Medical History:  Diagnosis Date  . Anxiety and depression 01/22/2016  . Benign non-nodular prostatic hyperplasia with lower urinary tract symptoms 01/22/2016  . Chest pain 05/01/2018  . Chest pain in adult 06/11/2018  . Degenerative lumbar spinal stenosis 01/22/2016  . Dyslipidemia 01/22/2016  . Gastroesophageal reflux disease without esophagitis 01/22/2016  . Hypertension 01/22/2016  . Neck pain 07/17/2017  . Paresthesia and pain of both upper extremities 07/17/2017  . Type 2 diabetes mellitus without complication, without long-term current use of insulin (HCC) 01/22/2016  . Wheezing on right side of chest on exhalation 07/17/2017    Past Surgical History:  Procedure Laterality Date  . BACK SURGERY    . HERNIA REPAIR    . LITHOTRIPSY      Current Medications: Current Meds  Medication Sig  . amLODipine (NORVASC) 10 MG tablet Take 10 mg by mouth daily.  . aspirin EC 81 MG tablet Take 81 mg by mouth daily.  . atorvastatin (LIPITOR) 40 MG tablet Take 40 mg by mouth daily.  . bisoprolol-hydrochlorothiazide (ZIAC) 5-6.25 MG tablet TAKE 1 TABLET BY MOUTH IN THE MORNING FOR BLOOD PRESSURE  . DULoxetine (CYMBALTA) 60 MG capsule Take 60 mg by mouth daily.  . fenofibrate 160 MG tablet Take 160 mg by mouth daily.  .   fluticasone (FLONASE) 50 MCG/ACT nasal spray Place 2 sprays into both nostrils daily as needed.  . glipiZIDE (GLUCOTROL XL) 10 MG 24 hr tablet Take 1 tablet by mouth 2 (two) times daily.  . JANUVIA 50 MG tablet Take 50 mg by mouth daily.  . lisinopril (PRINIVIL,ZESTRIL) 40 MG tablet Take 40 mg by mouth daily.  . loratadine (CLARITIN) 10 MG tablet Take 10 mg by mouth daily.  . metFORMIN (GLUCOPHAGE) 1000 MG tablet TAKE 1 TABLET (1,000 MG TOTAL) BY MOUTH 2 TIMES DAILY WITH MEALS.  . nitroGLYCERIN (NITROSTAT) 0.4 MG SL tablet Place 1 tablet (0.4  mg total) under the tongue every 5 (five) minutes as needed for chest pain (CALL 911 WITH NO RELIEF IN CHEST PAIN AFTER 3 PILLS.).  . pantoprazole (PROTONIX) 40 MG tablet TAKE 1 TABLET BY MOUTH EVERY MORNING BEFORE MEALS FOR REFLUX     Allergies:   Patient has no known allergies.   Social History   Socioeconomic History  . Marital status: Married    Spouse name: Not on file  . Number of children: Not on file  . Years of education: Not on file  . Highest education level: Not on file  Occupational History  . Not on file  Social Needs  . Financial resource strain: Not on file  . Food insecurity:    Worry: Not on file    Inability: Not on file  . Transportation needs:    Medical: Not on file    Non-medical: Not on file  Tobacco Use  . Smoking status: Former Smoker    Types: Cigars  . Smokeless tobacco: Former User  Substance and Sexual Activity  . Alcohol use: Not Currently    Frequency: Never  . Drug use: Not Currently  . Sexual activity: Not on file  Lifestyle  . Physical activity:    Days per week: Not on file    Minutes per session: Not on file  . Stress: Not on file  Relationships  . Social connections:    Talks on phone: Not on file    Gets together: Not on file    Attends religious service: Not on file    Active member of club or organization: Not on file    Attends meetings of clubs or organizations: Not on file    Relationship status: Not on file  Other Topics Concern  . Not on file  Social History Narrative  . Not on file     Family History: The patient's family history includes Diabetes in his maternal grandmother; Heart attack in his father; Hypertension in his father; Kidney failure in his mother. ROS:   Please see the history of present illness.    All other systems reviewed and are negative.  EKGs/Labs/Other Studies Reviewed:    The following studies were reviewed today:   06/12/2018: BUN 24; NT-Pro BNP 36; Potassium 4.3; Sodium 141 07/03/2018:  Creatinine, Ser 1.40  Recent Lipid Panel No results found for: CHOL, TRIG, HDL, CHOLHDL, VLDL, LDLCALC, LDLDIRECT  Physical Exam:    VS:  BP 136/74 (BP Location: Right Arm, Patient Position: Sitting, Cuff Size: Normal)   Pulse 67   Ht 5' 6" (1.676 m)   Wt 181 lb (82.1 kg)   SpO2 98%   BMI 29.21 kg/m     Wt Readings from Last 3 Encounters:  07/06/18 181 lb (82.1 kg)  06/12/18 183 lb 6.4 oz (83.2 kg)     GEN:  Well nourished, well developed in no acute   distress HEENT: Normal NECK: No JVD; No carotid bruits LYMPHATICS: No lymphadenopathy CARDIAC: RRR, no murmurs, rubs, gallops RESPIRATORY:  Clear to auscultation without rales, wheezing or rhonchi  ABDOMEN: Soft, non-tender, non-distended MUSCULOSKELETAL:  No edema; No deformity  SKIN: Warm and dry NEUROLOGIC:  Alert and oriented x 3 PSYCHIATRIC:  Normal affect    Signed, Brian Munley, MD  07/06/2018 11:00 AM    [facility]  

## 2018-07-06 ENCOUNTER — Ambulatory Visit (HOSPITAL_BASED_OUTPATIENT_CLINIC_OR_DEPARTMENT_OTHER)
Admission: RE | Admit: 2018-07-06 | Discharge: 2018-07-06 | Disposition: A | Discharge status: Home / Self Care | Payer: Medicare HMO | Source: Ambulatory Visit | Attending: Cardiology | Admitting: Cardiology

## 2018-07-06 ENCOUNTER — Encounter: Payer: Self-pay | Admitting: Cardiology

## 2018-07-06 ENCOUNTER — Ambulatory Visit: Payer: Medicare HMO | Admitting: Cardiology

## 2018-07-06 VITALS — BP 136/74 | HR 67 | Ht 66.0 in | Wt 181.0 lb

## 2018-07-06 DIAGNOSIS — I25118 Atherosclerotic heart disease of native coronary artery with other forms of angina pectoris: Secondary | ICD-10-CM | POA: Diagnosis not present

## 2018-07-06 DIAGNOSIS — Z01818 Encounter for other preprocedural examination: Secondary | ICD-10-CM

## 2018-07-06 DIAGNOSIS — I10 Essential (primary) hypertension: Secondary | ICD-10-CM

## 2018-07-06 DIAGNOSIS — E785 Hyperlipidemia, unspecified: Secondary | ICD-10-CM

## 2018-07-06 NOTE — Patient Instructions (Addendum)
Medication Instructions:  Your physician recommends that you continue on your current medications as directed. Please refer to the Current Medication list given to you today.   Labwork: Your physician recommends that you return for lab work today: CBC, BMP.  Testing/Procedures: A chest x-ray takes a picture of the organs and structures inside the chest, including the heart, lungs, and blood vessels. This test can show several things, including, whether the heart is enlarges; whether fluid is building up in the lungs; and whether pacemaker / defibrillator leads are still in place.     [facility] [place] [address], [address] [place] [address] Dept: [phone] Loc: [phone]  Elberta Fortisimothy E Bartram  07/06/2018  You are scheduled for a Cardiac Catheterization on Tuesday, August 20 with Dr. Lance MussJayadeep Varanasi.  1. Please arrive [facility] at [facility]: [address] [place], [place] [address] at 7:00 AM (This time is two hours before your procedure to ensure your preparation). Free valet parking service is available.   Special note: Every effort is made to have your procedure done on time. Please understand that emergencies sometimes delay scheduled procedures.  2. Diet: Do not eat solid foods after midnight.  The patient may have clear liquids until 5am upon the day of the procedure.  3. Labs: None needed.  4. Medication instructions in preparation for your procedure:   Contrast Allergy: No   Stop taking, Lisinopril (Zestril or Prinivil) on Monday, August 19. Stop taking bisoprolol-hydrochlorothiazide on Monday, 07/09/2018.   Stop Taking PO Diabetes Meds Glucophage (Metformin)on Tuesday, August 20. Do not resume this medication until 48 hours after this procedure is completed. Do not take januvia or glipizide the morning of your scheduled procedure.    On the  morning of your procedure, take your Aspirin and any morning medicines NOT listed above.  You may use sips of water.  5. Plan for one night stay--bring personal belongings. 6. Bring a current list of your medications and current insurance cards. 7. You MUST have a responsible person to drive you home. 8. Someone MUST be with you the first 24 hours after you arrive home or your discharge will be delayed. 9. Please wear clothes that are easy to get on and off and wear slip-on shoes.  Thank you for allowing us to care for you!   -- [facility]   Follow-Up: Your physician recommends that you schedule a follow-up appointment in: 6 weeks.   If you need a refill on your cardiac medications before your next appointment, please call your pharmacy.   Thank you for choosing [facility]! Mady Gemmaatherine Lockhart, RN [phone]    Carotid Angioplasty With Stent, Care After These instructions give you information about caring for yourself after your procedure. Your doctor may also give you more specific instructions. Call your doctor if you have any problems or questions after your procedure. Follow these instructions at home: Medicines  Take over-the-counter and prescription medicines only as told by your doctor.  If you were prescribed an antibiotic medicine, take it as told by your doctor. Do not stop taking the antibiotic even if you start to feel better. Caring for Your Cut from Surgery   Keep your cut clean and dry.  Follow instructions from your doctor about how to take care of your cut from surgery (incision). Make sure you: ? Wash your hands with soap and water  before you change your bandage (dressing). If you cannot use soap and water, use hand sanitizer. ? Change your bandage as told by your doctor. ? Leave stitches (sutures), skin glue, or skin tape (adhesive) strips in place. They may need to stay in place for 2 weeks or longer. If tape strips  get loose and curl up, you may trim the loose edges. Do not remove tape strips completely unless your doctor says it is okay.  Check the area around your cut every day for signs of infection. Check for: ? More redness, swelling, or pain. ? More fluid or blood. ? Warmth. ? Pus or a bad smell. Activity  Return to your normal activities as told by your doctor. Ask your doctor what activities are safe for you.  Do not lift anything that is heavier than 10 lb (4.5 kg) until your doctor says it is okay.  Avoid sexual activity until your doctor says that this is safe for you.  Exercise regularly, as told by your doctor. Ask your doctor what types of exercise are safe for you. Eating and drinking   Follow instructions from your doctor about what you cannot eat or drink.  Drink enough fluid to keep your pee (urine) clear or pale yellow.  Eat a heart-healthy diet. This should include lots of fresh fruits and vegetables. Meat should be lean cuts. Avoid foods that are: ? High in salt, saturated fat, or sugar. ? Canned or highly processed. ? Fried. Lifestyle  Limit alcohol intake to no more than 1 drink per day for nonpregnant women and 2 drinks per day for men. One drink equals 12 oz of beer, 5 oz of wine, or 1 oz of hard liquor.  Do not use any tobacco products, such as cigarettes, chewing tobacco, or e-cigarettes. If you need help quitting, ask your doctor.  Work with your doctor to keep your blood pressure under control.  Stay at a healthy weight. General instructions  Do not drive or use heavy machinery while taking prescription pain medicine.  Do not take baths, swim, or use a hot tub until your doctor approves.  Tell all doctors who care for you that you have a stent.  Keep all follow-up visits as told by your doctor. This is important. Contact a doctor if:  You have more redness, swelling, or pain around your cut.  You have more fluid or blood coming from your cut.  The  area around your cut feels warm to the touch.  You have pus or a bad smell coming from your cut.  You have a lump caused by bleeding under your skin (hematoma), and the lump does not go away after 2 weeks.  You have a fever. Get help right away if:  You have vision changes or loss of vision.  You have numbness or weakness on one side of your body.  You have trouble talking.  You have slurred speech or you cannot speak (aphasia).  You suddenly feel very confused.  You notice a lump caused by bleeding under your skin and the lump is quickly getting larger (expanding).  You suddenly get pain in the area where your stent was placed.  Your cut from surgery starts to bleed and does not stop after you hold pressure on it for 30 minutes. These symptoms may be an emergency. Do not wait to see if the symptoms will go away. Get help right away. Call your local emergency services (911 in [place] Do not drive yourself  to the hospital. This information is not intended to replace advice given to you by your health care provider. Make sure you discuss any questions you have with your health care provider. Document Released: 11/12/2013 Document Revised: 04/14/2016 Document Reviewed: 08/02/2015 Elsevier Interactive Patient Education  [employer]2018 [employer] Inc.

## 2018-07-07 LAB — BASIC METABOLIC PANEL
BUN/Creatinine Ratio: 16 (ref 10–24)
BUN: 24 mg/dL (ref 8–27)
CALCIUM: 10 mg/dL (ref 8.6–10.2)
CHLORIDE: 101 mmol/L (ref 96–106)
CO2: 18 mmol/L — ABNORMAL LOW (ref 20–29)
Creatinine, Ser: 1.5 mg/dL — ABNORMAL HIGH (ref 0.76–1.27)
GFR calc non Af Amer: 49 mL/min/{1.73_m2} — ABNORMAL LOW (ref >59)
GFR, EST AFRICAN AMERICAN: 57 mL/min/{1.73_m2} — AB (ref >59)
Glucose: 206 mg/dL — ABNORMAL HIGH (ref 65–99)
Potassium: 5.1 mmol/L (ref 3.5–5.2)
Sodium: 137 mmol/L (ref 134–144)

## 2018-07-07 LAB — CBC
HEMATOCRIT: 41.5 % (ref 37.5–51.0)
HEMOGLOBIN: 13.5 g/dL (ref 13.0–17.7)
MCH: 29.8 pg (ref 26.6–33.0)
MCHC: 32.5 g/dL (ref 31.5–35.7)
MCV: 92 fL (ref 79–97)
PLATELETS: 273 10*3/uL (ref 150–450)
RBC: 4.53 x10E6/uL (ref 4.14–5.80)
RDW: 13.9 % (ref 12.3–15.4)
WBC: 7.4 10*3/uL (ref 3.4–10.8)

## 2018-07-09 ENCOUNTER — Telehealth: Payer: Self-pay | Admitting: *Deleted

## 2018-07-09 NOTE — Telephone Encounter (Signed)
Pt contacted [facility]ter[facility] for: Tuesday July 10, 2018 9 AM Verified [facility] and [facility] at: 7 AM  No solid food after midnight prior to cath, clear liquids until 5 AM day of procedure. Verify allergies in Epic  Hold: Metformin -day of procedure and 48 hours post procedure. Januvia -AM of procedure. Glipizide -AM of procedure. Bisoprolol-HCT-day before and day of procedure. Lisinopril -day before and day of procedure. NSAIDS -day before and day of procedure.  Except hold medications AM meds can be  taken pre-cath with sip of water including: ASA 81 mg  Confirm patient has responsible person to drive home post procedure and for 24 hours after you arrive home.  LMTCB to discuss instructions with patient.

## 2018-07-09 NOTE — Telephone Encounter (Signed)
I discussed instructions with patient, he verbalized understanding, states he does not use NSAIDS.

## 2018-07-10 ENCOUNTER — Ambulatory Visit (HOSPITAL_COMMUNITY)
Admission: RE | Admit: 2018-07-10 | Discharge: 2018-07-11 | Disposition: A | Discharge status: Home / Self Care | Payer: Medicare HMO | Source: Ambulatory Visit | Attending: Interventional Cardiology | Admitting: Interventional Cardiology

## 2018-07-10 ENCOUNTER — Other Ambulatory Visit: Payer: Self-pay

## 2018-07-10 ENCOUNTER — Ambulatory Visit (HOSPITAL_COMMUNITY)
Admission: RE | Disposition: A | Discharge status: Home / Self Care | Payer: Self-pay | Source: Ambulatory Visit | Attending: Interventional Cardiology

## 2018-07-10 DIAGNOSIS — E785 Hyperlipidemia, unspecified: Secondary | ICD-10-CM | POA: Diagnosis not present

## 2018-07-10 DIAGNOSIS — R079 Chest pain, unspecified: Secondary | ICD-10-CM | POA: Diagnosis present

## 2018-07-10 DIAGNOSIS — Z79899 Other long term (current) drug therapy: Secondary | ICD-10-CM | POA: Insufficient documentation

## 2018-07-10 DIAGNOSIS — Z9889 Other specified postprocedural states: Secondary | ICD-10-CM | POA: Insufficient documentation

## 2018-07-10 DIAGNOSIS — I209 Angina pectoris, unspecified: Secondary | ICD-10-CM

## 2018-07-10 DIAGNOSIS — Z7982 Long term (current) use of aspirin: Secondary | ICD-10-CM | POA: Diagnosis not present

## 2018-07-10 DIAGNOSIS — F329 Major depressive disorder, single episode, unspecified: Secondary | ICD-10-CM | POA: Diagnosis not present

## 2018-07-10 DIAGNOSIS — E119 Type 2 diabetes mellitus without complications: Secondary | ICD-10-CM | POA: Diagnosis not present

## 2018-07-10 DIAGNOSIS — F419 Anxiety disorder, unspecified: Secondary | ICD-10-CM | POA: Insufficient documentation

## 2018-07-10 DIAGNOSIS — Z7984 Long term (current) use of oral hypoglycemic drugs: Secondary | ICD-10-CM | POA: Diagnosis not present

## 2018-07-10 DIAGNOSIS — Z833 Family history of diabetes mellitus: Secondary | ICD-10-CM | POA: Diagnosis not present

## 2018-07-10 DIAGNOSIS — Z955 Presence of coronary angioplasty implant and graft: Secondary | ICD-10-CM

## 2018-07-10 DIAGNOSIS — Z8249 Family history of ischemic heart disease and other diseases of the circulatory system: Secondary | ICD-10-CM | POA: Insufficient documentation

## 2018-07-10 DIAGNOSIS — N401 Enlarged prostate with lower urinary tract symptoms: Secondary | ICD-10-CM | POA: Insufficient documentation

## 2018-07-10 DIAGNOSIS — M48061 Spinal stenosis, lumbar region without neurogenic claudication: Secondary | ICD-10-CM | POA: Insufficient documentation

## 2018-07-10 DIAGNOSIS — Z87891 Personal history of nicotine dependence: Secondary | ICD-10-CM | POA: Diagnosis not present

## 2018-07-10 DIAGNOSIS — I10 Essential (primary) hypertension: Secondary | ICD-10-CM | POA: Diagnosis present

## 2018-07-10 DIAGNOSIS — I25118 Atherosclerotic heart disease of native coronary artery with other forms of angina pectoris: Secondary | ICD-10-CM | POA: Diagnosis present

## 2018-07-10 DIAGNOSIS — K219 Gastro-esophageal reflux disease without esophagitis: Secondary | ICD-10-CM | POA: Insufficient documentation

## 2018-07-10 DIAGNOSIS — Z95820 Peripheral vascular angioplasty status with implants and grafts: Secondary | ICD-10-CM

## 2018-07-10 HISTORY — DX: Atherosclerotic heart disease of native coronary artery without angina pectoris: I25.10

## 2018-07-10 HISTORY — PX: LEFT HEART CATH AND CORONARY ANGIOGRAPHY: CATH118249

## 2018-07-10 HISTORY — DX: Peripheral vascular angioplasty status with implants and grafts: Z95.820

## 2018-07-10 HISTORY — DX: Angina pectoris, unspecified: I20.9

## 2018-07-10 HISTORY — PX: CORONARY STENT INTERVENTION: CATH118234

## 2018-07-10 HISTORY — DX: Personal history of urinary calculi: Z87.442

## 2018-07-10 LAB — GLUCOSE, CAPILLARY
GLUCOSE-CAPILLARY: 208 mg/dL — AB (ref 70–99)
GLUCOSE-CAPILLARY: 207 mg/dL — AB (ref 70–99)
GLUCOSE-CAPILLARY: 249 mg/dL — AB (ref 70–99)
GLUCOSE-CAPILLARY: 207 mg/dL — AB (ref 70–99)
Glucose-Capillary: 245 mg/dL — ABNORMAL HIGH (ref 70–99)

## 2018-07-10 LAB — POCT ACTIVATED CLOTTING TIME: Activated Clotting Time: 274 seconds

## 2018-07-10 SURGERY — LEFT HEART CATH AND CORONARY ANGIOGRAPHY
Anesthesia: LOCAL

## 2018-07-10 MED ORDER — SODIUM CHLORIDE 0.9% FLUSH: 3.0000 mL | INTRAVENOUS | Status: DC | PRN

## 2018-07-10 MED ORDER — CLOPIDOGREL BISULFATE 300 MG PO TABS
ORAL_TABLET | ORAL | Status: DC | PRN
  Administered 2018-07-10: 600 mg via ORAL

## 2018-07-10 MED ORDER — AMLODIPINE BESYLATE 10 MG PO TABS
10.0000 mg | ORAL_TABLET | Freq: Every day | ORAL | Status: DC
  Administered 2018-07-11: 09:00:00 10 mg via ORAL
  Filled 2018-07-10: qty 1

## 2018-07-10 MED ORDER — GLIPIZIDE ER 5 MG PO TB24
10.0000 mg | ORAL_TABLET | Freq: Two times a day (BID) | ORAL | Status: DC
  Administered 2018-07-10: 22:00:00 10 mg via ORAL
  Filled 2018-07-10 (×2): qty 2

## 2018-07-10 MED ORDER — FAMOTIDINE IN NACL 20-0.9 MG/50ML-% IV SOLN: 20.0000 mg | Freq: Once | INTRAVENOUS | Status: DC

## 2018-07-10 MED ORDER — FLUTICASONE PROPIONATE 50 MCG/ACT NA SUSP
2.0000 | Freq: Every day | NASAL | Status: DC
  Administered 2018-07-11: 2 via NASAL
  Filled 2018-07-10: qty 16

## 2018-07-10 MED ORDER — PANTOPRAZOLE SODIUM 40 MG PO TBEC
40.0000 mg | DELAYED_RELEASE_TABLET | Freq: Every day | ORAL | Status: DC
  Administered 2018-07-11: 40 mg via ORAL
  Filled 2018-07-10 (×2): qty 1

## 2018-07-10 MED ORDER — IOHEXOL 350 MG/ML SOLN
INTRAVENOUS | Status: DC | PRN
  Administered 2018-07-10: 85 mL via INTRA_ARTERIAL

## 2018-07-10 MED ORDER — FAMOTIDINE IN NACL 20-0.9 MG/50ML-% IV SOLN
INTRAVENOUS | Status: AC
  Filled 2018-07-10: qty 50

## 2018-07-10 MED ORDER — TIROFIBAN HCL IV 12.5 MG/250 ML
INTRAVENOUS | Status: DC | PRN
  Administered 2018-07-10: 0.075 ug/kg/min via INTRAVENOUS

## 2018-07-10 MED ORDER — LIDOCAINE HCL (PF) 1 % IJ SOLN
INTRAMUSCULAR | Status: AC
  Filled 2018-07-10: qty 30

## 2018-07-10 MED ORDER — ACETAMINOPHEN 325 MG PO TABS
650.0000 mg | ORAL_TABLET | Freq: Four times a day (QID) | ORAL | Status: DC | PRN
  Administered 2018-07-10: 650 mg via ORAL
  Filled 2018-07-10: qty 2

## 2018-07-10 MED ORDER — ANGIOPLASTY BOOK
Freq: Once | Status: AC
  Administered 2018-07-10: 22:00:00 1
  Filled 2018-07-10: qty 1

## 2018-07-10 MED ORDER — LIDOCAINE HCL (PF) 1 % IJ SOLN
INTRAMUSCULAR | Status: DC | PRN
  Administered 2018-07-10: 2 mL via INTRADERMAL

## 2018-07-10 MED ORDER — SODIUM CHLORIDE 0.9 % WEIGHT BASED INFUSION
1.0000 mL/kg/h | INTRAVENOUS | Status: DC
  Administered 2018-07-10: 250 mL via INTRAVENOUS

## 2018-07-10 MED ORDER — ONDANSETRON HCL 4 MG/2ML IJ SOLN: 4.0000 mg | Freq: Four times a day (QID) | INTRAMUSCULAR | Status: DC | PRN

## 2018-07-10 MED ORDER — ASPIRIN 81 MG PO CHEW: 81.0000 mg | CHEWABLE_TABLET | Freq: Every day | ORAL | Status: DC

## 2018-07-10 MED ORDER — LORATADINE 10 MG PO TABS
10.0000 mg | ORAL_TABLET | Freq: Every day | ORAL | Status: DC
  Administered 2018-07-11: 09:00:00 10 mg via ORAL
  Filled 2018-07-10: qty 1

## 2018-07-10 MED ORDER — TIROFIBAN HCL IV 12.5 MG/250 ML
INTRAVENOUS | Status: AC
  Filled 2018-07-10: qty 250

## 2018-07-10 MED ORDER — LABETALOL HCL 5 MG/ML IV SOLN: 10.0000 mg | INTRAVENOUS | Status: AC | PRN

## 2018-07-10 MED ORDER — BISOPROLOL FUMARATE 5 MG PO TABS
5.0000 mg | ORAL_TABLET | Freq: Every day | ORAL | Status: DC
  Administered 2018-07-11: 5 mg via ORAL
  Filled 2018-07-10: qty 1

## 2018-07-10 MED ORDER — HEPARIN (PORCINE) IN NACL 1000-0.9 UT/500ML-% IV SOLN
INTRAVENOUS | Status: DC | PRN
  Administered 2018-07-10 (×2): 500 mL

## 2018-07-10 MED ORDER — ASPIRIN EC 81 MG PO TBEC
81.0000 mg | DELAYED_RELEASE_TABLET | Freq: Every day | ORAL | Status: DC
  Administered 2018-07-11: 09:00:00 81 mg via ORAL
  Filled 2018-07-10: qty 1

## 2018-07-10 MED ORDER — SODIUM CHLORIDE 0.9 % WEIGHT BASED INFUSION
3.0000 mL/kg/h | INTRAVENOUS | Status: DC
  Administered 2018-07-10: 3 mL/kg/h via INTRAVENOUS

## 2018-07-10 MED ORDER — SODIUM CHLORIDE 0.9% FLUSH
3.0000 mL | Freq: Two times a day (BID) | INTRAVENOUS | Status: DC
  Administered 2018-07-10: 18:00:00 3 mL via INTRAVENOUS

## 2018-07-10 MED ORDER — HEPARIN SODIUM (PORCINE) 1000 UNIT/ML IJ SOLN
INTRAMUSCULAR | Status: AC
  Filled 2018-07-10: qty 1

## 2018-07-10 MED ORDER — VERAPAMIL HCL 2.5 MG/ML IV SOLN
INTRAVENOUS | Status: DC | PRN
  Administered 2018-07-10 (×2): 10 mL via INTRA_ARTERIAL

## 2018-07-10 MED ORDER — SODIUM CHLORIDE 0.9 % IV SOLN: INTRAVENOUS | Status: AC

## 2018-07-10 MED ORDER — ACETAMINOPHEN 325 MG PO TABS: 650.0000 mg | ORAL_TABLET | ORAL | Status: DC | PRN

## 2018-07-10 MED ORDER — HEPARIN SODIUM (PORCINE) 1000 UNIT/ML IJ SOLN
INTRAMUSCULAR | Status: DC | PRN
  Administered 2018-07-10: 4000 [IU] via INTRAVENOUS
  Administered 2018-07-10: 5000 [IU] via INTRAVENOUS
  Administered 2018-07-10: 2000 [IU] via INTRAVENOUS

## 2018-07-10 MED ORDER — FENOFIBRATE 160 MG PO TABS
160.0000 mg | ORAL_TABLET | Freq: Every day | ORAL | Status: DC
  Administered 2018-07-10 – 2018-07-11 (×2): 160 mg via ORAL
  Filled 2018-07-10 (×2): qty 1

## 2018-07-10 MED ORDER — AMOXICILLIN 250 MG/5ML PO SUSR
850.0000 mg | Freq: Every day | ORAL | Status: DC
  Filled 2018-07-10 (×2): qty 20

## 2018-07-10 MED ORDER — NITROGLYCERIN 1 MG/10 ML FOR IR/CATH LAB
INTRA_ARTERIAL | Status: DC | PRN
  Administered 2018-07-10 (×2): 200 ug via INTRACORONARY
  Administered 2018-07-10: 300 ug via INTRA_ARTERIAL

## 2018-07-10 MED ORDER — NITROGLYCERIN 0.4 MG SL SUBL: 0.4000 mg | SUBLINGUAL_TABLET | SUBLINGUAL | Status: DC | PRN

## 2018-07-10 MED ORDER — HEPARIN (PORCINE) IN NACL 1000-0.9 UT/500ML-% IV SOLN
INTRAVENOUS | Status: AC
  Filled 2018-07-10: qty 1000

## 2018-07-10 MED ORDER — BISOPROLOL-HYDROCHLOROTHIAZIDE 5-6.25 MG PO TABS: 1.0000 | ORAL_TABLET | Freq: Every day | ORAL | Status: DC

## 2018-07-10 MED ORDER — ACETAMINOPHEN 325 MG PO TABS
ORAL_TABLET | ORAL | Status: AC
  Filled 2018-07-10: qty 2

## 2018-07-10 MED ORDER — SODIUM CHLORIDE 0.9% FLUSH: 3.0000 mL | Freq: Two times a day (BID) | INTRAVENOUS | Status: DC

## 2018-07-10 MED ORDER — MIDAZOLAM HCL 2 MG/2ML IJ SOLN
INTRAMUSCULAR | Status: AC
  Filled 2018-07-10: qty 2

## 2018-07-10 MED ORDER — SODIUM CHLORIDE 0.9 % IV SOLN: 250.0000 mL | INTRAVENOUS | Status: DC | PRN

## 2018-07-10 MED ORDER — CLOPIDOGREL BISULFATE 75 MG PO TABS
75.0000 mg | ORAL_TABLET | Freq: Every day | ORAL | Status: DC
  Administered 2018-07-11: 09:00:00 75 mg via ORAL
  Filled 2018-07-10: qty 1

## 2018-07-10 MED ORDER — INSULIN ASPART 100 UNIT/ML ~~LOC~~ SOLN
0.0000 [IU] | Freq: Three times a day (TID) | SUBCUTANEOUS | Status: DC
  Administered 2018-07-11: 07:00:00 2 [IU] via SUBCUTANEOUS

## 2018-07-10 MED ORDER — HYDRALAZINE HCL 20 MG/ML IJ SOLN: 5.0000 mg | INTRAMUSCULAR | Status: AC | PRN

## 2018-07-10 MED ORDER — FENTANYL CITRATE (PF) 100 MCG/2ML IJ SOLN
INTRAMUSCULAR | Status: DC | PRN
  Administered 2018-07-10 (×2): 25 ug via INTRAVENOUS

## 2018-07-10 MED ORDER — CLOPIDOGREL BISULFATE 300 MG PO TABS
ORAL_TABLET | ORAL | Status: AC
  Filled 2018-07-10: qty 2

## 2018-07-10 MED ORDER — LINAGLIPTIN 5 MG PO TABS
5.0000 mg | ORAL_TABLET | Freq: Every day | ORAL | Status: DC
  Administered 2018-07-11: 09:00:00 5 mg via ORAL
  Filled 2018-07-10: qty 1

## 2018-07-10 MED ORDER — ATORVASTATIN CALCIUM 40 MG PO TABS
40.0000 mg | ORAL_TABLET | Freq: Every day | ORAL | Status: DC
  Administered 2018-07-10 – 2018-07-11 (×2): 40 mg via ORAL
  Filled 2018-07-10 (×2): qty 1

## 2018-07-10 MED ORDER — HYDROCHLOROTHIAZIDE 10 MG/ML ORAL SUSPENSION
6.2500 mg | Freq: Every day | ORAL | Status: DC
  Administered 2018-07-11: 11:00:00 6.25 mg via ORAL
  Filled 2018-07-10 (×2): qty 1.25

## 2018-07-10 MED ORDER — DULOXETINE HCL 60 MG PO CPEP
60.0000 mg | ORAL_CAPSULE | Freq: Every day | ORAL | Status: DC
  Administered 2018-07-11: 60 mg via ORAL
  Filled 2018-07-10: qty 1

## 2018-07-10 MED ORDER — MIDAZOLAM HCL 2 MG/2ML IJ SOLN
INTRAMUSCULAR | Status: DC | PRN
  Administered 2018-07-10: 2 mg via INTRAVENOUS
  Administered 2018-07-10: 1 mg via INTRAVENOUS

## 2018-07-10 MED ORDER — ASPIRIN 81 MG PO CHEW: 81.0000 mg | CHEWABLE_TABLET | ORAL | Status: DC

## 2018-07-10 MED ORDER — FENTANYL CITRATE (PF) 100 MCG/2ML IJ SOLN
INTRAMUSCULAR | Status: AC
  Filled 2018-07-10: qty 2

## 2018-07-10 MED ORDER — TIROFIBAN (AGGRASTAT) BOLUS VIA INFUSION
INTRAVENOUS | Status: DC | PRN
  Administered 2018-07-10: 2040 ug via INTRAVENOUS

## 2018-07-10 SURGICAL SUPPLY — 16 items
BALLN ~~LOC~~ EMERGE MR 3.75X12 (BALLOONS) ×2
BALLOON ~~LOC~~ EMERGE MR 3.75X12 (BALLOONS) ×1 IMPLANT
CATH 5FR JL3.5 JR4 ANG PIG MP (CATHETERS) ×2 IMPLANT
CATH LAUNCHER 6FR EBU 3 (CATHETERS) ×2 IMPLANT
DEVICE RAD COMP TR BAND LRG (VASCULAR PRODUCTS) ×2 IMPLANT
GLIDESHEATH SLEND SS 6F .021 (SHEATH) ×2 IMPLANT
GUIDEWIRE INQWIRE 1.5J.035X260 (WIRE) ×1 IMPLANT
INQWIRE 1.5J .035X260CM (WIRE) ×2
KIT ENCORE 26 ADVANTAGE (KITS) ×2 IMPLANT
KIT HEART LEFT (KITS) ×2 IMPLANT
KIT HEMO VALVE WATCHDOG (MISCELLANEOUS) ×2 IMPLANT
PACK CARDIAC CATHETERIZATION (CUSTOM PROCEDURE TRAY) ×2 IMPLANT
STENT SYNERGY DES 3.5X20 (Permanent Stent) ×2 IMPLANT
TRANSDUCER W/STOPCOCK (MISCELLANEOUS) ×2 IMPLANT
TUBING CIL FLEX 10 FLL-RA (TUBING) ×2 IMPLANT
WIRE ASAHI PROWATER 180CM (WIRE) ×2 IMPLANT

## 2018-07-10 NOTE — Progress Notes (Signed)
TR BAND REMOVAL  LOCATION:    Radial right  DEFLATED PER PROTOCOL:     TIME BAND OFF / DRESSING APPLIED:      SITE UPON ARRIVAL:    Level  1  SITE AFTER BAND REMOVAL:    Level 1  CIRCULATION SENSATION AND MOVEMENT:    Within Normal Limits :yes  COMMENTS:    Mild raised area proximal to band-pressure applied and resolved

## 2018-07-10 NOTE — Progress Notes (Signed)
Pt took Januvia this morning. CBG 208 at this time. Noted on cath lab chart check.

## 2018-07-10 NOTE — Progress Notes (Signed)
Pt leaves cath lab holding area in stable condition. Rt radial is bruised but soft. Dressing is CDI. Level 1.Tegaderm and a 2x2 to rt radial site. 2+ rt radial.

## 2018-07-10 NOTE — Interval H&P Note (Signed)
Cath Lab Visit (complete for each Cath Lab visit)  Clinical Evaluation Leading to the Procedure:   ACS: No.  Non-ACS:    Anginal Classification: CCS III  Anti-ischemic medical therapy: Maximal Therapy (2 or more classes of medications)  Non-Invasive Test Results: Intermediate-risk stress test findings: cardiac mortality 1-3%/year abnormal CTA  Prior CABG: No previous CABG      History and Physical Interval Note:  07/10/2018 8:34 AM  Nicholas Joseph  has presented today for surgery, with the diagnosis of cp   abn ct  The various methods of treatment have been discussed with the patient and family. After consideration of risks, benefits and other options for treatment, the patient has consented to  Procedure(s): LEFT HEART CATH AND CORONARY ANGIOGRAPHY (N/A) as a surgical intervention .  The patient's history has been reviewed, patient examined, no change in status, stable for surgery.  I have reviewed the patient's chart and labs.  Questions were answered to the patient's satisfaction.     Lance MussJayadeep Wendal Wilkie

## 2018-07-11 ENCOUNTER — Encounter (HOSPITAL_COMMUNITY): Payer: Self-pay | Admitting: Interventional Cardiology

## 2018-07-11 ENCOUNTER — Other Ambulatory Visit: Payer: Self-pay

## 2018-07-11 DIAGNOSIS — E119 Type 2 diabetes mellitus without complications: Secondary | ICD-10-CM | POA: Diagnosis not present

## 2018-07-11 DIAGNOSIS — K219 Gastro-esophageal reflux disease without esophagitis: Secondary | ICD-10-CM | POA: Diagnosis not present

## 2018-07-11 DIAGNOSIS — I209 Angina pectoris, unspecified: Secondary | ICD-10-CM

## 2018-07-11 DIAGNOSIS — R079 Chest pain, unspecified: Secondary | ICD-10-CM

## 2018-07-11 DIAGNOSIS — I25118 Atherosclerotic heart disease of native coronary artery with other forms of angina pectoris: Secondary | ICD-10-CM | POA: Diagnosis not present

## 2018-07-11 DIAGNOSIS — Z95820 Peripheral vascular angioplasty status with implants and grafts: Secondary | ICD-10-CM

## 2018-07-11 DIAGNOSIS — I10 Essential (primary) hypertension: Secondary | ICD-10-CM | POA: Diagnosis not present

## 2018-07-11 HISTORY — DX: Peripheral vascular angioplasty status with implants and grafts: Z95.820

## 2018-07-11 LAB — BASIC METABOLIC PANEL
ANION GAP: 11 (ref 5–15)
BUN: 16 mg/dL (ref 8–23)
CALCIUM: 9.1 mg/dL (ref 8.9–10.3)
CO2: 22 mmol/L (ref 22–32)
Chloride: 103 mmol/L (ref 98–111)
Creatinine, Ser: 1.33 mg/dL — ABNORMAL HIGH (ref 0.61–1.24)
GFR calc non Af Amer: 56 mL/min — ABNORMAL LOW (ref >60)
GLUCOSE: 244 mg/dL — AB (ref 70–99)
POTASSIUM: 4 mmol/L (ref 3.5–5.1)
Sodium: 136 mmol/L (ref 135–145)

## 2018-07-11 LAB — CBC
HEMATOCRIT: 35 % — AB (ref 39.0–52.0)
HEMOGLOBIN: 11.7 g/dL — AB (ref 13.0–17.0)
MCH: 30.5 pg (ref 26.0–34.0)
MCHC: 33.4 g/dL (ref 30.0–36.0)
MCV: 91.1 fL (ref 78.0–100.0)
Platelets: 166 10*3/uL (ref 150–400)
RBC: 3.84 MIL/uL — AB (ref 4.22–5.81)
RDW: 12.4 % (ref 11.5–15.5)
WBC: 7.5 10*3/uL (ref 4.0–10.5)

## 2018-07-11 LAB — GLUCOSE, CAPILLARY: GLUCOSE-CAPILLARY: 149 mg/dL — AB (ref 70–99)

## 2018-07-11 MED ORDER — CLOPIDOGREL BISULFATE 75 MG PO TABS: 75.0000 mg | ORAL_TABLET | Freq: Every day | ORAL | 3 refills | Status: DC

## 2018-07-11 MED FILL — Famotidine in NaCl 0.9% IV Soln 20 MG/50ML: INTRAVENOUS | Qty: 50 | Status: AC

## 2018-07-11 NOTE — Progress Notes (Signed)
Dc instructions given to pt at this time.  Pt verbalized understanding of all instructions.  No s/s of any distress noted.

## 2018-07-11 NOTE — Progress Notes (Signed)
CARDIAC REHAB PHASE I   PRE:  Rate/Rhythm: 83 SR    BP: sitting 154/89    SaO2:   MODE:  Ambulation: 500 ft   POST:  Rate/Rhythm: 119 ST    BP: sitting 185/89     SaO2:   Tolerated well, no c/o. Feels well.  Ed completed, good reception. Understands Plavix/ASA. Will refer to [place] CRPII. He is motivated by diet and ex and taking his meds.  [id number]  Harriet MassonRandi Kristan Reesha Debes CES, ACSM 07/11/2018 9:29 AM

## 2018-07-11 NOTE — Discharge Summary (Addendum)
Discharge Summary    Patient ID: Nicholas Joseph,  MRN: [id number], DOB/AGE: 06-06-1956 62 y.o.  Admit date: 07/10/2018 Discharge date: 07/11/2018  Primary Care Provider: Georga Kaufmannorum, Lisa Leigh Primary Cardiologist: Norman HerrlichBrian Munley, MD  Discharge Diagnoses    Principal Problem:   Angina pectoris [facility](HCC) Active Problems:   Chest pain in adult   Hypertension   Dyslipidemia   Type 2 diabetes mellitus without complication, without long-term current use of insulin (HCC)   Coronary artery disease of native artery of native heart with stable angina pectoris (HCC)   S/P angioplasty with DES 07/10/2018    Allergies No Known Allergies  Diagnostic Studies/Procedures    07/10/2018 Left Heart Cath and Coronary Angiography  Mid LAD lesion is 75% stenosed.  A drug-eluting stent was successfully placed using a STENT SYNERGY DES 3.5X20.  Post intervention, there is a 0% residual stenosis.  The left ventricular systolic function is normal.  LV end diastolic pressure is normal. LVEDP 6 mm Hg.  The left ventricular ejection fraction is 55-65% by visual estimate.  There is no aortic valve stenosis.  Recommend uninterrupted dual antiplatelet therapy with Aspirin 81mg  daily and Clopidogrel 75mg  daily for a minimum of 6 months (stable ischemic heart disease - Class I recommendation). If clopidogrel has to be stopped sooner for surgery, this could be safely stopped after 1 month since a Synergy stent was used.    History of Present Illness     62 year old male with history of exertional chest pain and associated shortness of breath, hypertension, dyslipidemia on statin, and DM2. On 06/12/2018, he presented to Dr. Baldo DaubBrian J Munley of [facility] 06/12/2018 with a 2 month history of progressive and limiting exertional chest pain associated with SOB. The chest pain was described as non-pleuritic and evening pain, occurring every other day. He further described the pain as mild to moderate and  closer to substernal pressure with SOB and that radiated through his back and into his left arm, spontaneously resolving in approximately 5 minutes. At that time, he had no known history of underlying lung or cardiac disease at that time and previous myocardial perfusion studies were normal.  06/12/18 EKG demonstrated normal sinus rhythm and CMP was normal except for glucose 303 (A1C 7.2% 05/01/18) and GFR 53. Recent lipid panel at that time showed cholesterol 167, HDL 38, LDL 102, and nonHDL 129. He reportedly continued to exhibit angina, increasing to daily in frequency and occurring with minor (activity class III) on 2 antianginal agents despite reported compliance with diet, lifestyle and medications. He was subsequently referred for coronary angiography. On 07/03/2018, he underwent a cardiac CTA showing severe proximal LAD stenosis (FFR 0.65) with recommendation for left heart catheterization and possible intervention if appropriate due to stable angina pectoris with flow limiting proximal LAD stenois.    Hospital Course     Consultants: None  On 07/10/2018, the patient underwent left heart catheterization and coronary angiography s/p abnormal cardiac CTA showing severe proximal LAD stenosis (FFR 0.65) on 07/03/2018. The risks, benefits, and details of the procedure were explained to the patient before the procedure, and he verbalized understanding and wanted to proceed and informed written consent was obtained. Patient received pre-catheterization hydration due to renal function. Right radial catheterization was then performed. After Xylocaine anesthesia a 18F slender sheath was placed in the right radial artery with a single anterior needle wall stick. IV Heparin was given. Left ventriculography was done using a JR4 catheter. Right coronary angiography  was done using a Judkins R4 guide catheter. Left coronary angiography was done using a Judkins L3.5 guide catheter. A TR band was used for hemostasis. IV  heparin and tirofiban used for anticoagulation and 85 cc of contrast with estimated blood loss <22mL. Conscious sedation was maintained using Versed 3mg , Fentanyl and the patient's heart rate, BP, and oxygen saturation continuously monitored for 50 minutes and without complication. Findings at that time were mid eccentric LAD lesion, 75% stenosed and small RCA. Catheterization revealed a normal LV size, systolic function and LVEDP normal, EF 55-65% by visual estimate and without regional wall abnormalities or AV stenosis. Intervention was performed with a DES using a STENT SYNERGY DES 3.5X20 and stent strut was well apposed. Post-stent angioplasty was performed using a BALLOON Metzger EMERGE MR [id number]. The intervention was documented as successful with pre-interventional TIMI flow of 3 and post-intervention TIMI flow of 3. No complications occurred at the lesion and documented 0% residual stenosis post intervention.   Patient left the catheterization lab holding area in stable condition. Right radial cath site was documented at that time as bruised but soft and dressing clean, dry and intact with Tegaderm and a 2x2 to the right radial site as well as 2+ radial pulse. Follow-up labs today 07/11/18 are notable for glucose 244  149, Cr 1.33, GFR 56, RBC 3.84, Hgb 11.7, and Hct 35 and otherwise unremarkable. Post cath inpatient phase I cardiac rehab completed 07/10/2018.   Patient has been examined by Dr. Elease Hashimoto and is currently stable and ready for discharge home with ASA, ACEI  blocker, CCB, statin, SL nitroglycerin, and Plavix with follow-up and phase II cardiac rehab. He reports he feels well and R radial cath site remains clean, dry, and intact.  _____________ Physical Exam   Discharge Vitals Blood pressure (!) 141/89, pulse 73, temperature 97.6 F (36.4 C), temperature source Oral, resp. rate 12, height 5\' 6"  (1.676 m), weight 82 kg, SpO2 97 %.  Filed Weights   07/10/18 0628 07/11/18 0447  Weight:  81.6 kg 82 kg    GEN: Well nourished, well developed, in no acute distress. Sitting in chair having just ambulated. HEENT: Grossly normal.  Neck: Supple, no JVD, carotid bruits, or masses. Cardiac: RRR, no murmurs, rubs, or gallops. No clubbing, cyanosis, edema.  Radials/DP/PT 2+ and equal bilaterally.  Respiratory:  Respirations regular and unlabored, clear to auscultation bilaterally. GI: Soft, nontender, nondistended, BS + x 4. MS: no deformity or atrophy. Skin: warm and dry, no rash. R radial cath site clean, dry, intact without any signs of swelling or infection.  Neuro:  Strength and sensation are intact. Psych: AAOx3.  Normal affect.  Labs & Radiologic Studies    CBC Recent Labs    07/11/18 0215  WBC 7.5  HGB 11.7*  HCT 35.0*  MCV 91.1  PLT 166   Basic Metabolic Panel Recent Labs    40/98/11 0215  NA 136  K 4.0  CL 103  CO2 22  GLUCOSE 244*  BUN 16  CREATININE 1.33*  CALCIUM 9.1   Liver Function Tests No results for input(s): AST, ALT, ALKPHOS, BILITOT, PROT, ALBUMIN in the last 72 hours. No results for input(s): LIPASE, AMYLASE in the last 72 hours. Cardiac Enzymes No results for input(s): CKTOTAL, CKMB, CKMBINDEX, TROPONINI in the last 72 hours. BNP Invalid input(s): POCBNP D-Dimer No results for input(s): DDIMER in the last 72 hours. Hemoglobin A1C No results for input(s): HGBA1C in the last 72 hours. Fasting Lipid Panel No  results for input(s): CHOL, HDL, LDLCALC, TRIG, CHOLHDL, LDLDIRECT in the last 72 hours. Thyroid Function Tests No results for input(s): TSH, T4TOTAL, T3FREE, THYROIDAB in the last 72 hours.  Invalid input(s): FREET3 _____________  Dg Chest 2 View  Result Date: 07/06/2018 CLINICAL DATA:  Preoperative cardiac catheterization. Chest pain. Hypertension. EXAM: CHEST - 2 VIEW COMPARISON:  December 27, 2010 FINDINGS: No edema or consolidation. Heart size and pulmonary vascularity are normal. No adenopathy. No pneumothorax. No bone  lesions. IMPRESSION: No edema or consolidation. Electronically Signed   By: Bretta Bang III M.D.   On: 07/06/2018 15:40   Ct Coronary Morph W/cta Cor W/score W/ca W/cm &/or Wo/cm  Addendum Date: 07/03/2018   ADDENDUM REPORT: 07/03/2018 10:05 CLINICAL DATA:  36 -year-old male with DM, HTN, HLP, DOE and chest pain. EXAM: Cardiac/Coronary  CT TECHNIQUE: The patient was scanned on a [employer]. FINDINGS: A 120 kV prospective scan was triggered in the descending thoracic aorta at 111 HU's. Axial non-contrast 3 mm slices were carried out through the heart. The data set was analyzed on a dedicated work station and scored using the Agatson method. Gantry rotation speed was 250 msecs and collimation was .6 mm. No beta blockade and 0.8 mg of sl NTG was given. The 3D data set was reconstructed in 5% intervals of the 67-82 % of the R-R cycle. Diastolic phases were analyzed on a dedicated work station using MPR, MIP and VRT modes. The patient received 80 cc of contrast. Aorta: Normal size. No calcifications, minimal atheroma. No dissection. Aortic Valve:  Trileaflet.  Trivial calcifications. Coronary Arteries:  Normal coronary origin.  Left dominance. Left main is a large artery that gives rise to LAD and LCX arteries. Left main has minimal mixed plaque with stenosis 0-25%. LAD is a large vessel that gives rise to one small diagonal artery and wraps around the apex. Ostial/proximal LAD has mild diffuse calcified plaque with stenosis 25-50%. This is followed by a moderate non-calcified plaque with negative remodeling and stenosis 50-69% but possibly > 70%. Mid and distal LAD have minimal plaque. LCX is a large dominant artery that gives rise to two large OM branches, PDA and PLVB. There is mild long calcified plaque in the proximal and mid LCX artery with associated stenosis 25-50%. OM1 has a minimal calcified plaque in the proximal segment with stenosis 0-25%, OM2 has no significant plaque. PDA and PLVB  have no significant plaque. RCA is a small non-dominant artery that has minimal plaque. Other findings: Normal pulmonary vein drainage into the left atrium. Normal let atrial appendage without a thrombus. Normal size of the pulmonary artery. IMPRESSION: 1. Coronary calcium score of 383. This was 56 percentile for age and sex matched control. 2. Normal coronary origin with left dominance. 3. At least moderate stenosis in the proximal LAD. Additional analysis with CT FFR will be submitted. Electronically Signed   By: Tobias Alexander   On: 07/03/2018 10:05   Result Date: 07/03/2018 EXAM: OVER-READ INTERPRETATION  CT CHEST The following report is an over-read performed by radiologist Dr. Charlett Nose of [facility] on 07/03/2018. This over-read does not include interpretation of cardiac or coronary anatomy or pathology. The coronary CTA interpretation by the cardiologist is attached. COMPARISON:  None. FINDINGS: Vascular: Heart is normal size.  Visualized aorta is normal caliber. Mediastinum/Nodes: No adenopathy in the lower mediastinum or hila. Lungs/Pleura: Visualized lungs clear.  No effusions. Upper Abdomen: Imaging into the upper abdomen shows no acute findings. Musculoskeletal: Chest  wall soft tissues are unremarkable. No acute bony abnormality. IMPRESSION: No acute or significant extracardiac abnormality. Electronically Signed: By: Charlett Nose M.D. On: 07/03/2018 09:33   Ct Coronary Fractional Flow Reserve Data Prep  Result Date: 07/03/2018 EXAM: FF/RCT ANALYSIS CLINICAL DATA:  63 year old male with chest pain. FINDINGS: FFRct analysis was performed on the original cardiac CT angiogram dataset. Diagrammatic representation of the FFRct analysis is provided in a separate PDF document in PACS. This dictation was created using the PDF document and an interactive 3D model of the results. 3D model is not available in the EMR/PACS. Normal FFR range is >0.80. 1. Left Main:  No significant stenosis. 2.  LAD: Proximal LAD CT FFR: 0.95, mid LAD CT FFR 0.65. 3. LCX: No significant stenosis. 4. RCA: No significant stenosis. IMPRESSION: 1. CT FFR analysis showed severe stenosis in the proximal LAD. A left cardiac catheterization is recommended. Electronically Signed   By: Tobias Alexander   On: 07/03/2018 15:04   Disposition   Pt is being discharged home today in good condition.  Follow-up Plans & Appointments   Follow-up Information    Baldo Daub, MD Follow up.   Specialty:  Cardiology Why:  07/17/2018 at 2:00PM with Dr. Dulce Sellar in the [facility] office  Contact information: [address] [place] [address] [phone]          Discharge Instructions    Amb Referral to Cardiac Rehabilitation   Complete by:  As directed    To [place]   Diagnosis:   Coronary Stents PTCA        Discharge Medications   Allergies as of 07/11/2018   No Known Allergies     Medication List    TAKE these medications   amLODipine 10 MG tablet Commonly known as:  NORVASC Take 10 mg by mouth daily.   amoxicillin 875 MG tablet Commonly known as:  AMOXIL Take 850 mg by mouth daily.   aspirin EC 81 MG tablet Take 81 mg by mouth daily.   atorvastatin 40 MG tablet Commonly known as:  LIPITOR Take 40 mg by mouth daily.   bisoprolol-hydrochlorothiazide 5-6.25 MG tablet Commonly known as:  ZIAC Take 1 tablet by mouth daily.   clopidogrel 75 MG tablet Commonly known as:  PLAVIX Take 1 tablet (75 mg total) by mouth daily with breakfast. Start taking on:  07/12/2018   DULoxetine 60 MG capsule Commonly known as:  CYMBALTA Take 60 mg by mouth daily.   fenofibrate 160 MG tablet Take 160 mg by mouth daily.   fluticasone 50 MCG/ACT nasal spray Commonly known as:  FLONASE Place 2 sprays into both nostrils daily.   glipiZIDE 10 MG 24 hr tablet Commonly known as:  GLUCOTROL XL Take 10 mg by mouth 2 (two) times daily.   JANUVIA 50 MG tablet Generic drug:  sitaGLIPtin Take 50 mg by  mouth daily.   lisinopril 40 MG tablet Commonly known as:  PRINIVIL,ZESTRIL Take 40 mg by mouth daily.   loratadine 10 MG tablet Commonly known as:  CLARITIN Take 10 mg by mouth daily.   metFORMIN 1000 MG tablet Commonly known as:  GLUCOPHAGE Take 1,000 mg by mouth 2 (two) times daily with a meal.   nitroGLYCERIN 0.4 MG SL tablet Commonly known as:  NITROSTAT Place 1 tablet (0.4 mg total) under the tongue every 5 (five) minutes as needed for chest pain (CALL 911 WITH NO RELIEF IN CHEST PAIN AFTER 3 PILLS.).   pantoprazole 40 MG tablet Commonly known as:  PROTONIX Take 40 mg by mouth daily.        Aspirin prescribed at discharge?  Yes High Intensity Statin Prescribed? (Lipitor 40-80mg  or Crestor 20-40mg ): Yes Beta Blocker Prescribed? Yes For EF <40%, was ACEI/ARB Prescribed? Yes ADP Receptor Inhibitor Prescribed? (i.e. Plavix etc.-Includes Medically Managed Patients): Yes For EF <40%, Aldosterone Inhibitor Prescribed? Yes Was EF assessed during THIS hospitalization? Yes Was Cardiac Rehab II ordered? (Included Medically managed Patients): Yes   Outstanding Labs/Studies   None  Duration of Discharge Encounter   Greater than 30 minutes including physician time.  Signed, Lennon AlstromJacquelyn D Visser PA-C 07/11/2018, 9:54 AM Pager [phone]  Attending Note:   The patient was seen and examined.  Agree with assessment and plan as noted above.  Changes made to the above note as needed.  Patient seen and independently examined with  Marisue IvanJacquelyn Visser, PA .   We discussed all aspects of the encounter. I agree with the assessment and plan as stated above.  1.  Coronary artery disease.  He is now status post stenting of his mid LAD.  A synergy 3.5 x 20 mm stent was placed.  He is stable for discharge.  His right femoral artery cath site looks good. The plan is for dual antiplatelet therapy for 12 months.  A Synergy stent was used so the Plavix could be stopped after 1 month if needed  for urgent surgery.  Wants scripts sent into [facility] , [address], [place]  Follow up with Dr. Dulce SellarMunley in several weeks    I have spent a total of 40 minutes with patient reviewing hospital  notes , telemetry, EKGs, labs and examining patient as well as establishing an assessment and plan that was discussed with the patient. > 50% of time was spent in direct patient care.    Vesta MixerPhilip J. Remedios Mckone, Montez HagemanJr., MD, [employer] 07/11/2018, 10:04 AM [address],  [address] Office [phone] Pager [phone]

## 2018-07-11 NOTE — Discharge Instructions (Signed)
NO HEAVY LIFTING OR SEXUAL ACTIVITY X 7 DAYS. NO DRIVING X 2-3 DAYS. NO SOAKING BATHS, HOT TUBS, POOLS, ETC., X 7 DAYS. DO NOT stop Asprin or Plavix or you could have a heart attack.    Radial Site Care Refer to this sheet in the next few weeks. These instructions provide you with information on caring for yourself after your procedure. Your caregiver may also give you more specific instructions. Your treatment has been planned according to current medical practices, but problems sometimes occur. Call your caregiver if you have any problems or questions after your procedure. HOME CARE INSTRUCTIONS  You may shower the day after the procedure.Remove the bandage (dressing) and gently wash the site with plain soap and water.Gently pat the site dry.   Do not apply powder or lotion to the site.   Do not submerge the affected site in water for 3 to 5 days.   Inspect the site at least twice daily.   Do not flex or bend the affected arm for 24 hours.   No lifting over 5 pounds (2.3 kg) for 5 days after your procedure.   Do not drive home if you are discharged the same day of the procedure. Have someone else drive you.   You may drive 24 hours after the procedure unless otherwise instructed by your caregiver.  What to expect:  Any bruising will usually fade within 1 to 2 weeks.   Blood that collects in the tissue (hematoma) may be painful to the touch. It should usually decrease in size and tenderness within 1 to 2 weeks.  SEEK IMMEDIATE MEDICAL CARE IF:  You have unusual pain at the radial site.   You have redness, warmth, swelling, or pain at the radial site.   You have drainage (other than a small amount of blood on the dressing).   You have chills.   You have a fever or persistent symptoms for more than 72 hours.   You have a fever and your symptoms suddenly get worse.   Your arm becomes pale, cool, tingly, or numb.   You have heavy bleeding from the site. Hold pressure on  the site.      10 Habits of Highly Healthy People  [place] wants to help you get well and stay well.  Live a longer, healthier life by practicing healthy habits every day.  1.  Visit your primary care provider regularly. 2.  Make time for family and friends.  Healthy relationships are important. 3.  Take medications as directed by your provider. 4.  Maintain a healthy weight and a trim waistline. 5.  Eat healthy meals and snacks, rich in fruits, vegetables, whole grains, and lean proteins. 6.  Get moving every day - aim for 150 minutes of moderate physical activity each week. 7.  Don't smoke. 8.  Avoid alcohol or drink in moderation. 9.  Manage stress through meditation or mindful relaxation. 10.  Get seven to nine hours of quality sleep each night.  Want more information on healthy habits?  To learn more about these and other healthy habits, visit [email or web address]. _____________

## 2018-07-16 NOTE — Progress Notes (Signed)
Cardiology Office Note:    Date:  07/17/2018   ID:  Nicholas Fortisimothy E Scollard, DOB Jun 05, 1956, MRN [id number]  PCP:  Georga Kaufmannorum, Lisa Leigh, MD  Cardiologist:  Norman HerrlichBrian Ivis Henneman, MD    Referring MD: Georga Kaufmannorum, Lisa Leigh, MD    ASSESSMENT:    1. Coronary artery disease of native artery of native heart with stable angina pectoris (HCC)   2. Essential hypertension   3. Dyslipidemia    PLAN:    In order of problems listed above:  1. Improved after PCI and stent he is quite happy with the quality of his life compliant with and tolerates medications including dual antiplatelet and will continue the same regimen until seen by me in the office in 6 months.  I asked if any questions come up about dual antiplatelet therapy.physicians told me. 2. Stable blood pressure target continue current treatment including ACE calcium channel blocker and beta-blocker 3. Stable continue high intensity statin   Next appointment: 6 months   Medication Adjustments/Labs and Tests Ordered: Current medicines are reviewed at length with the patient today.  Concerns regarding medicines are outlined above.  No orders of the defined types were placed in this encounter.  No orders of the defined types were placed in this encounter.   No chief complaint on file.   History of Present Illness:    Nicholas Joseph is a 62 y.o. male with a hx of angina, hypertension and dyslipidemia last seen by me 06/12/18 and referred for cardiac CTA with severe LAD stenosis.Marland Kitchen. He had a left heart cath and PCI with DES of mid LAD 07/10/18  Compliance with diet, lifestyle and medications: Yes  He was improved by the day after the procedure his sense of being ill fatigued short of breath chest pain and exercise intolerance have resolved he has no problems tolerating his dual antiplatelet therapy he is pleased with the quality of his life Past Medical History:  Diagnosis Date  . Anxiety and depression 01/22/2016  . Benign non-nodular prostatic  hyperplasia with lower urinary tract symptoms 01/22/2016  . Chest pain 05/01/2018  . Chest pain in adult 06/11/2018  . Coronary artery disease   . Degenerative lumbar spinal stenosis 01/22/2016  . Dyslipidemia 01/22/2016  . Gastroesophageal reflux disease without esophagitis 01/22/2016  . History of kidney stones   . Hypertension 01/22/2016  . Neck pain 07/17/2017  . Paresthesia and pain of both upper extremities 07/17/2017  . S/P angioplasty with stent 07/10/2018 07/11/2018  . Type 2 diabetes mellitus without complication, without long-term current use of insulin (HCC) 01/22/2016  . Wheezing on right side of chest on exhalation 07/17/2017    Past Surgical History:  Procedure Laterality Date  . BACK SURGERY    . CORONARY STENT INTERVENTION N/A 07/10/2018   Procedure: CORONARY STENT INTERVENTION;  Surgeon: Corky CraftsVaranasi, Jayadeep S, MD;  [facility];  Service: Cardiovascular;  Laterality: N/A;  . HERNIA REPAIR    . LEFT HEART CATH AND CORONARY ANGIOGRAPHY N/A 07/10/2018   Procedure: LEFT HEART CATH AND CORONARY ANGIOGRAPHY;  Surgeon: Corky CraftsVaranasi, Jayadeep S, MD;  [facility];  Service: Cardiovascular;  Laterality: N/A;  . LITHOTRIPSY      Current Medications: Current Meds  Medication Sig  . amLODipine (NORVASC) 10 MG tablet Take 10 mg by mouth daily.  Marland Kitchen. aspirin EC 81 MG tablet Take 81 mg by mouth daily.  Marland Kitchen. atorvastatin (LIPITOR) 40 MG tablet Take 40 mg by mouth daily.  . bisoprolol-hydrochlorothiazide [facility](ZIAC) 5-6.25  MG tablet Take 1 tablet by mouth daily.   . clopidogrel (PLAVIX) 75 MG tablet Take 1 tablet (75 mg total) by mouth daily with breakfast.  . DULoxetine (CYMBALTA) 60 MG capsule Take 60 mg by mouth daily.  . fenofibrate 160 MG tablet Take 160 mg by mouth daily.  . fluticasone (FLONASE) 50 MCG/ACT nasal spray Place 2 sprays into both nostrils daily.   Marland Kitchen glipiZIDE (GLUCOTROL XL) 10 MG 24 hr tablet Take 10 mg by mouth 2 (two) times daily.   Marland Kitchen JANUVIA 50 MG tablet Take  50 mg by mouth daily.  Marland Kitchen lisinopril (PRINIVIL,ZESTRIL) 40 MG tablet Take 40 mg by mouth daily.  Marland Kitchen loratadine (CLARITIN) 10 MG tablet Take 10 mg by mouth daily.  . metFORMIN (GLUCOPHAGE) 1000 MG tablet Take 1,000 mg by mouth 2 (two) times daily with a meal.   . nitroGLYCERIN (NITROSTAT) 0.4 MG SL tablet Place 1 tablet (0.4 mg total) under the tongue every 5 (five) minutes as needed for chest pain (CALL 911 WITH NO RELIEF IN CHEST PAIN AFTER 3 PILLS.).  Marland Kitchen pantoprazole (PROTONIX) 40 MG tablet Take 40 mg by mouth daily.      Allergies:   Patient has no known allergies.   Social History   Socioeconomic History  . Marital status: Married    Spouse name: Not on file  . Number of children: Not on file  . Years of education: Not on file  . Highest education level: Not on file  Occupational History  . Not on file  Social Needs  . Financial resource strain: Not on file  . Food insecurity:    Worry: Not on file    Inability: Not on file  . Transportation needs:    Medical: Not on file    Non-medical: Not on file  Tobacco Use  . Smoking status: Former Smoker    Types: Cigars  . Smokeless tobacco: Former Engineer, water and Sexual Activity  . Alcohol use: Not Currently    Frequency: Never  . Drug use: Not Currently  . Sexual activity: Not on file  Lifestyle  . Physical activity:    Days per week: Not on file    Minutes per session: Not on file  . Stress: Not on file  Relationships  . Social connections:    Talks on phone: Not on file    Gets together: Not on file    Attends religious service: Not on file    Active member of club or organization: Not on file    Attends meetings of clubs or organizations: Not on file    Relationship status: Not on file  Other Topics Concern  . Not on file  Social History Narrative  . Not on file     Family History: The patient's family history includes Diabetes in his maternal grandmother; Heart attack in his father; Hypertension in his  father; Kidney failure in his mother. ROS:   Please see the history of present illness.    All other systems reviewed and are negative.  EKGs/Labs/Other Studies Reviewed:    The following studies were reviewed today:    Recent Labs: 06/12/2018: NT-Pro BNP 36 07/11/2018: BUN 16; Creatinine, Ser 1.33; Hemoglobin 11.7; Platelets 166; Potassium 4.0; Sodium 136  Recent Lipid Panel No results found for: CHOL, TRIG, HDL, CHOLHDL, VLDL, LDLCALC, LDLDIRECT  Physical Exam:    VS:  BP 116/68 (BP Location: Left Arm, Patient Position: Sitting, Cuff Size: Normal)   Pulse 76   Ht  5\' 6"  (1.676 m)   Wt 180 lb 12.8 oz (82 kg)   SpO2 96%   BMI 29.18 kg/m     Wt Readings from Last 3 Encounters:  07/17/18 180 lb 12.8 oz (82 kg)  07/11/18 180 lb 12.4 oz (82 kg)  07/06/18 181 lb (82.1 kg)     GEN:  Well nourished, well developed in no acute distress HEENT: Normal NECK: No JVD; No carotid bruits LYMPHATICS: No lymphadenopathy CARDIAC: RRR, no murmurs, rubs, gallops RESPIRATORY:  Clear to auscultation without rales, wheezing or rhonchi  ABDOMEN: Soft, non-tender, non-distended MUSCULOSKELETAL:  No edema; No deformity  SKIN: Warm and dry NEUROLOGIC:  Alert and oriented x 3 PSYCHIATRIC:  Normal affect    Signed, Norman Herrlich, MD  07/17/2018 5:46 PM    [facility]

## 2018-07-17 ENCOUNTER — Encounter: Payer: Self-pay | Admitting: Cardiology

## 2018-07-17 ENCOUNTER — Ambulatory Visit (INDEPENDENT_AMBULATORY_CARE_PROVIDER_SITE_OTHER): Payer: Medicare HMO | Admitting: Cardiology

## 2018-07-17 VITALS — BP 116/68 | HR 76 | Ht 66.0 in | Wt 180.8 lb

## 2018-07-17 DIAGNOSIS — I10 Essential (primary) hypertension: Secondary | ICD-10-CM

## 2018-07-17 DIAGNOSIS — E785 Hyperlipidemia, unspecified: Secondary | ICD-10-CM | POA: Diagnosis not present

## 2018-07-17 DIAGNOSIS — I25118 Atherosclerotic heart disease of native coronary artery with other forms of angina pectoris: Secondary | ICD-10-CM

## 2018-07-17 NOTE — Patient Instructions (Signed)

## 2018-08-16 NOTE — Progress Notes (Signed)
Cardiology Office Note:    Date:  08/17/2018   ID:  Nicholas Joseph, DOB 11/15/1956, MRN [id number]  PCP:  Georga Kaufmann, MD  Cardiologist:  Norman Herrlich, MD    Referring MD: Georga Kaufmann, MD    ASSESSMENT:    1. Coronary artery disease of native artery of native heart with stable angina pectoris (HCC)   2. Essential hypertension   3. Type 2 diabetes mellitus without complication, without long-term current use of insulin (HCC)   4. Dyslipidemia    PLAN:    In order of problems listed above:  1. CAD is chronic stable having no angina New York Heart Association class I continue current treatment under interrupted dual antiplatelet 1 year and intensify lipid-lowering therapy with a residual LDL of greater than 160 and high intensity statin.  I will plan to see him back in the office in 1 year after PCI at that time either drop aspirin or clopidogrel. 2. Blood pressure stable at target continue current treatment including ACE inhibitor with diabetes CAD 3. Stable managed by his PCP he is on appropriate therapy with metformin and Januvia with CAD 4. Clinically he has familial hyperlipidemia with an LDL greater than 150 and a high intensity statin I asked him to add Zetia 10 mg daily follow-up lipid profile with his PCP in 1 month.  I would like to see a minimum of LDL less than 100 ideally less than 70 and if not would need to consider the merits of PCSK9 therapy.  Also continue his fenofibrate for triglycerides   Next appointment: August 2020   Medication Adjustments/Labs and Tests Ordered: Current medicines are reviewed at length with the patient today.  Concerns regarding medicines are outlined above.  No orders of the defined types were placed in this encounter.  No orders of the defined types were placed in this encounter.   Chief Complaint  Patient presents with  . Follow-up  . Coronary Artery Disease    PCI 07/10/18    History of Present Illness:    Nicholas Joseph is a 62 y.o. male with a hx of angina hypertension dyslipidemia CAD and PCI and stent drug-eluting was mid LAD 07/10/2018.  He was last seen 07/17/2018. Compliance with diet, lifestyle and medications: Yes  He had a good result with PCI pleased with the quality of his life he has had no angina exertional dyspnea anginal equivalent palpitations syncope or TIA.  He is compliant and tolerates dual antiplatelet therapy. Past Medical History:  Diagnosis Date  . Anxiety and depression 01/22/2016  . Benign non-nodular prostatic hyperplasia with lower urinary tract symptoms 01/22/2016  . Chest pain 05/01/2018  . Chest pain in adult 06/11/2018  . Coronary artery disease   . Degenerative lumbar spinal stenosis 01/22/2016  . Dyslipidemia 01/22/2016  . Gastroesophageal reflux disease without esophagitis 01/22/2016  . History of kidney stones   . Hypertension 01/22/2016  . Neck pain 07/17/2017  . Paresthesia and pain of both upper extremities 07/17/2017  . S/P angioplasty with stent 07/10/2018 07/11/2018  . Type 2 diabetes mellitus without complication, without long-term current use of insulin (HCC) 01/22/2016  . Wheezing on right side of chest on exhalation 07/17/2017    Past Surgical History:  Procedure Laterality Date  . BACK SURGERY    . CORONARY STENT INTERVENTION N/A 07/10/2018   Procedure: CORONARY STENT INTERVENTION;  Surgeon: Corky Crafts, MD;  Location: [facility] INVASIVE CV LAB;  Service: Cardiovascular;  Laterality: N/A;  .  HERNIA REPAIR    . LEFT HEART CATH AND CORONARY ANGIOGRAPHY N/A 07/10/2018   Procedure: LEFT HEART CATH AND CORONARY ANGIOGRAPHY;  Surgeon: Corky Crafts, MD;  Location: [facility] INVASIVE CV LAB;  Service: Cardiovascular;  Laterality: N/A;  . LITHOTRIPSY      Current Medications: Current Meds  Medication Sig  . amLODipine (NORVASC) 10 MG tablet Take 10 mg by mouth daily.  Marland Kitchen aspirin EC 81 MG tablet Take 81 mg by mouth daily.  Marland Kitchen atorvastatin (LIPITOR) 40 MG tablet Take 40  mg by mouth daily.  . bisoprolol-hydrochlorothiazide (ZIAC) 5-6.25 MG tablet Take 1 tablet by mouth daily.   . cephALEXin (KEFLEX) 500 MG capsule TAKE 1 CAPSULE BY MOUTH THREE TIMES DAILY FOR 4 DAYS WITH FOOD  . clopidogrel (PLAVIX) 75 MG tablet Take 1 tablet (75 mg total) by mouth daily with breakfast.  . DULoxetine (CYMBALTA) 60 MG capsule Take 60 mg by mouth daily.  . fenofibrate 160 MG tablet Take 160 mg by mouth daily.  . fluticasone (FLONASE) 50 MCG/ACT nasal spray Place 2 sprays into both nostrils daily.   Marland Kitchen glipiZIDE (GLUCOTROL XL) 10 MG 24 hr tablet Take 10 mg by mouth 2 (two) times daily.   Marland Kitchen JANUVIA 50 MG tablet Take 50 mg by mouth daily.  Marland Kitchen lisinopril (PRINIVIL,ZESTRIL) 40 MG tablet Take 40 mg by mouth daily.  Marland Kitchen loratadine (CLARITIN) 10 MG tablet Take 10 mg by mouth daily.  . metFORMIN (GLUCOPHAGE) 1000 MG tablet Take 1,000 mg by mouth 2 (two) times daily with a meal.   . nitroGLYCERIN (NITROSTAT) 0.4 MG SL tablet Place 1 tablet (0.4 mg total) under the tongue every 5 (five) minutes as needed for chest pain (CALL 911 WITH NO RELIEF IN CHEST PAIN AFTER 3 PILLS.).  Marland Kitchen pantoprazole (PROTONIX) 40 MG tablet Take 40 mg by mouth daily.      Allergies:   Patient has no known allergies.   Social History   Socioeconomic History  . Marital status: Married    Spouse name: Not on file  . Number of children: Not on file  . Years of education: Not on file  . Highest education level: Not on file  Occupational History  . Not on file  Social Needs  . Financial resource strain: Not on file  . Food insecurity:    Worry: Not on file    Inability: Not on file  . Transportation needs:    Medical: Not on file    Non-medical: Not on file  Tobacco Use  . Smoking status: Former Smoker    Types: Cigars  . Smokeless tobacco: Former Engineer, water and Sexual Activity  . Alcohol use: Not Currently    Frequency: Never  . Drug use: Not Currently  . Sexual activity: Not on file  Lifestyle  .  Physical activity:    Days per week: Not on file    Minutes per session: Not on file  . Stress: Not on file  Relationships  . Social connections:    Talks on phone: Not on file    Gets together: Not on file    Attends religious service: Not on file    Active member of club or organization: Not on file    Attends meetings of clubs or organizations: Not on file    Relationship status: Not on file  Other Topics Concern  . Not on file  Social History Narrative  . Not on file     Family History: The patient's  family history includes Diabetes in his maternal grandmother; Heart attack in his father; Hypertension in his father; Kidney failure in his mother. ROS:   Please see the history of present illness.    All other systems reviewed and are negative.  EKGs/Labs/Other Studies Reviewed:    The following studies were reviewed today:  EKG:  EKG ordered today.  The ekg ordered today demonstrates [facility] and is normal  Recent Labs:   Performed 08/14/2018 creatinine 1.67 potassium 5.1 GFR 43 total cholesterol 197 HDL 44 LDL direct 162 triglycerides 204 06/12/2018: NT-Pro BNP 36 07/11/2018: BUN 16; Creatinine, Ser 1.33; Hemoglobin 11.7; Platelets 166; Potassium 4.0; Sodium 136  Recent Lipid Panel No results found for: CHOL, TRIG, HDL, CHOLHDL, VLDL, LDLCALC, LDLDIRECT  Physical Exam:    VS:  BP 120/84 (BP Location: Right Arm, Patient Position: Sitting, Cuff Size: Normal)   Pulse 84   Ht 5\' 6"  (1.676 m)   Wt 180 lb 3.2 oz (81.7 kg)   SpO2 97%   BMI 29.09 kg/m     Wt Readings from Last 3 Encounters:  08/17/18 180 lb 3.2 oz (81.7 kg)  07/17/18 180 lb 12.8 oz (82 kg)  07/11/18 180 lb 12.4 oz (82 kg)     GEN:  Well nourished, well developed in no acute distress HEENT: Normal NECK: No JVD; No carotid bruits LYMPHATICS: No lymphadenopathy CARDIAC: RRR, no murmurs, rubs, gallops RESPIRATORY:  Clear to auscultation without rales, wheezing or rhonchi  ABDOMEN: Soft, non-tender,  non-distended MUSCULOSKELETAL:  No edema; No deformity  SKIN: Warm and dry NEUROLOGIC:  Alert and oriented x 3 PSYCHIATRIC:  Normal affect    Signed, Norman Herrlich, MD  08/17/2018 8:24 AM    [facility]

## 2018-08-17 ENCOUNTER — Ambulatory Visit: Payer: Medicare HMO | Admitting: Cardiology

## 2018-08-17 ENCOUNTER — Encounter: Payer: Self-pay | Admitting: Cardiology

## 2018-08-17 VITALS — BP 120/84 | HR 84 | Ht 66.0 in | Wt 180.2 lb

## 2018-08-17 DIAGNOSIS — E119 Type 2 diabetes mellitus without complications: Secondary | ICD-10-CM

## 2018-08-17 DIAGNOSIS — I10 Essential (primary) hypertension: Secondary | ICD-10-CM | POA: Diagnosis not present

## 2018-08-17 DIAGNOSIS — E785 Hyperlipidemia, unspecified: Secondary | ICD-10-CM

## 2018-08-17 DIAGNOSIS — I25118 Atherosclerotic heart disease of native coronary artery with other forms of angina pectoris: Secondary | ICD-10-CM | POA: Diagnosis not present

## 2018-08-17 MED ORDER — EZETIMIBE 10 MG PO TABS: 10.0000 mg | ORAL_TABLET | Freq: Every day | ORAL | 3 refills | Status: DC

## 2018-08-17 NOTE — Patient Instructions (Signed)
Medication Instructions:  Your physician has recommended you make the following change in your medication:   START: Zetia 10mg  one tablet daily   Labwork: You will have lipids checked at PCP office in 1 month.  Testing/Procedures: You will have EKG today  Follow-Up: Your physician wants you to follow-up in: 1 year.   You will receive a reminder letter in the mail two months in advance. If you don't receive a letter, please call our office to schedule the follow-up appointment.   Any Other Special Instructions Will Be Listed Below (If Applicable).     If you need a refill on your cardiac medications before your next appointment, please call your pharmacy.

## 2018-08-17 NOTE — Addendum Note (Signed)
Addended by: Lamona Curl on: 08/17/2018 09:04 AM   Modules accepted: Orders

## 2018-08-20 DIAGNOSIS — Z0000 Encounter for general adult medical examination without abnormal findings: Secondary | ICD-10-CM | POA: Insufficient documentation

## 2018-08-20 HISTORY — DX: Encounter for general adult medical examination without abnormal findings: Z00.00

## 2018-08-25 DIAGNOSIS — R809 Proteinuria, unspecified: Secondary | ICD-10-CM | POA: Insufficient documentation

## 2018-08-25 DIAGNOSIS — D044 Carcinoma in situ of skin of scalp and neck: Secondary | ICD-10-CM

## 2018-08-25 HISTORY — DX: Proteinuria, unspecified: R80.9

## 2018-08-25 HISTORY — DX: Carcinoma in situ of skin of scalp and neck: D04.4

## 2018-09-22 DIAGNOSIS — I2510 Atherosclerotic heart disease of native coronary artery without angina pectoris: Secondary | ICD-10-CM

## 2018-09-22 DIAGNOSIS — E860 Dehydration: Secondary | ICD-10-CM

## 2018-09-22 DIAGNOSIS — N179 Acute kidney failure, unspecified: Secondary | ICD-10-CM | POA: Diagnosis not present

## 2018-09-22 DIAGNOSIS — E871 Hypo-osmolality and hyponatremia: Secondary | ICD-10-CM

## 2018-09-22 DIAGNOSIS — E1165 Type 2 diabetes mellitus with hyperglycemia: Secondary | ICD-10-CM

## 2018-09-22 DIAGNOSIS — R7989 Other specified abnormal findings of blood chemistry: Secondary | ICD-10-CM

## 2018-09-22 DIAGNOSIS — K859 Acute pancreatitis without necrosis or infection, unspecified: Secondary | ICD-10-CM | POA: Diagnosis not present

## 2018-09-23 DIAGNOSIS — N179 Acute kidney failure, unspecified: Secondary | ICD-10-CM | POA: Diagnosis not present

## 2018-09-23 DIAGNOSIS — K859 Acute pancreatitis without necrosis or infection, unspecified: Secondary | ICD-10-CM | POA: Diagnosis not present

## 2018-09-23 DIAGNOSIS — E1165 Type 2 diabetes mellitus with hyperglycemia: Secondary | ICD-10-CM | POA: Diagnosis not present

## 2018-09-23 DIAGNOSIS — E871 Hypo-osmolality and hyponatremia: Secondary | ICD-10-CM | POA: Diagnosis not present

## 2018-09-24 DIAGNOSIS — E871 Hypo-osmolality and hyponatremia: Secondary | ICD-10-CM | POA: Diagnosis not present

## 2018-09-24 DIAGNOSIS — E1165 Type 2 diabetes mellitus with hyperglycemia: Secondary | ICD-10-CM | POA: Diagnosis not present

## 2018-09-24 DIAGNOSIS — K859 Acute pancreatitis without necrosis or infection, unspecified: Secondary | ICD-10-CM | POA: Diagnosis not present

## 2018-09-24 DIAGNOSIS — N179 Acute kidney failure, unspecified: Secondary | ICD-10-CM | POA: Diagnosis not present

## 2018-09-25 DIAGNOSIS — E871 Hypo-osmolality and hyponatremia: Secondary | ICD-10-CM | POA: Diagnosis not present

## 2018-09-25 DIAGNOSIS — N179 Acute kidney failure, unspecified: Secondary | ICD-10-CM | POA: Diagnosis not present

## 2018-09-25 DIAGNOSIS — E1165 Type 2 diabetes mellitus with hyperglycemia: Secondary | ICD-10-CM | POA: Diagnosis not present

## 2018-09-25 DIAGNOSIS — K859 Acute pancreatitis without necrosis or infection, unspecified: Secondary | ICD-10-CM | POA: Diagnosis not present

## 2018-10-04 ENCOUNTER — Telehealth: Payer: Self-pay | Admitting: Cardiology

## 2018-10-04 ENCOUNTER — Encounter: Payer: Self-pay | Admitting: *Deleted

## 2018-10-04 DIAGNOSIS — R0609 Other forms of dyspnea: Secondary | ICD-10-CM

## 2018-10-04 HISTORY — DX: Other forms of dyspnea: R06.09

## 2018-10-04 NOTE — Telephone Encounter (Signed)
Needs more dx codes for labs drawn in July

## 2018-10-04 NOTE — Telephone Encounter (Signed)
Form was sent to our office requesting this information. It was filled out, signed by Dr. Dulce SellarMunley, and successfully fax[facility] back to [facility].

## 2018-10-08 DIAGNOSIS — E663 Overweight: Secondary | ICD-10-CM | POA: Insufficient documentation

## 2018-10-08 HISTORY — DX: Overweight: E66.3

## 2018-12-14 ENCOUNTER — Telehealth: Payer: Self-pay

## 2018-12-14 DIAGNOSIS — K219 Gastro-esophageal reflux disease without esophagitis: Secondary | ICD-10-CM

## 2018-12-14 DIAGNOSIS — E1165 Type 2 diabetes mellitus with hyperglycemia: Secondary | ICD-10-CM

## 2018-12-14 DIAGNOSIS — F329 Major depressive disorder, single episode, unspecified: Secondary | ICD-10-CM | POA: Diagnosis not present

## 2018-12-14 DIAGNOSIS — I10 Essential (primary) hypertension: Secondary | ICD-10-CM | POA: Diagnosis not present

## 2018-12-14 DIAGNOSIS — R079 Chest pain, unspecified: Secondary | ICD-10-CM

## 2018-12-14 NOTE — Telephone Encounter (Signed)
Pt walked into office today w/c/o chest pain, chest tightness onset last night. Pt states he has not taken nitro and felt like the discomfort would go away. He denies SHOB. No visual s.s of acute distress noted during assessment. Pt was strongly advised to go to ER. Pt states he did not go to ER at first stating he thought if a doctor sent him there it would not cost him anything. Education provided to pt. Pt declines EMS service as I encouraged as he states, " They charged me $700.00 last time." Thea AlkenLisa Welch called pts son however he did not answer. We offered to call the pts wife but he declines as he states she is in [place]. Pt was strongly encouraged that I call EMS however pt declines and states he will drive himself. Pt advised this is against medical advice and he voiced understanding that leaving is AMA as EMS services have been advised. [facility].

## 2018-12-15 DIAGNOSIS — K219 Gastro-esophageal reflux disease without esophagitis: Secondary | ICD-10-CM | POA: Diagnosis not present

## 2018-12-15 DIAGNOSIS — R079 Chest pain, unspecified: Secondary | ICD-10-CM | POA: Diagnosis not present

## 2018-12-15 DIAGNOSIS — E1165 Type 2 diabetes mellitus with hyperglycemia: Secondary | ICD-10-CM | POA: Diagnosis not present

## 2018-12-15 DIAGNOSIS — F329 Major depressive disorder, single episode, unspecified: Secondary | ICD-10-CM | POA: Diagnosis not present

## 2018-12-20 DIAGNOSIS — L570 Actinic keratosis: Secondary | ICD-10-CM

## 2018-12-20 HISTORY — DX: Actinic keratosis: L57.0

## 2018-12-31 ENCOUNTER — Telehealth: Payer: Self-pay | Admitting: Cardiology

## 2018-12-31 ENCOUNTER — Telehealth: Payer: Self-pay

## 2018-12-31 MED ORDER — NITROGLYCERIN 0.4 MG SL SUBL: 0.4000 mg | SUBLINGUAL_TABLET | SUBLINGUAL | 6 refills | Status: DC | PRN

## 2018-12-31 MED ORDER — EZETIMIBE 10 MG PO TABS: 10.0000 mg | ORAL_TABLET | Freq: Every day | ORAL | 1 refills | Status: DC

## 2018-12-31 MED ORDER — EZETIMIBE 10 MG PO TABS: 10.0000 mg | ORAL_TABLET | Freq: Every day | ORAL | 2 refills | Status: DC

## 2018-12-31 NOTE — Telephone Encounter (Signed)
°*  STAT* If patient is at the pharmacy, call can be transferred to refill team.   1. Which medications need to be refilled? (please list name of each medication and dose if known) Ezetimibe   2. Which pharmacy/location (including street and city if local pharmacy) is medication to be sent to? [facility]   3. Do they need a 30 day or 90 day supply? 90   ALSO    *STAT* If patient is at the pharmacy, call can be transferred to refill team.   1. Which medications need to be refilled? (please list name of each medication and dose if known) Nitro Pill   2. Which pharmacy/location (including street and city if local pharmacy) is medication to be sent to? [facility]   3. Do they need a 30 day or 90 day supply? 1

## 2018-12-31 NOTE — Telephone Encounter (Signed)
[facility]etia and [facility]glycerin [facility] [place] as requested.

## 2018-12-31 NOTE — Telephone Encounter (Signed)
90day refill of ezetimbe 10mg  [facility].

## 2019-07-15 DIAGNOSIS — F3341 Major depressive disorder, recurrent, in partial remission: Secondary | ICD-10-CM | POA: Insufficient documentation

## 2019-07-15 DIAGNOSIS — R413 Other amnesia: Secondary | ICD-10-CM | POA: Insufficient documentation

## 2019-07-15 DIAGNOSIS — I10 Essential (primary) hypertension: Secondary | ICD-10-CM | POA: Diagnosis not present

## 2019-07-15 DIAGNOSIS — Z0000 Encounter for general adult medical examination without abnormal findings: Secondary | ICD-10-CM | POA: Diagnosis not present

## 2019-07-15 DIAGNOSIS — R7989 Other specified abnormal findings of blood chemistry: Secondary | ICD-10-CM

## 2019-07-15 DIAGNOSIS — Z125 Encounter for screening for malignant neoplasm of prostate: Secondary | ICD-10-CM | POA: Diagnosis not present

## 2019-07-15 DIAGNOSIS — Z23 Encounter for immunization: Secondary | ICD-10-CM | POA: Diagnosis not present

## 2019-07-15 DIAGNOSIS — E785 Hyperlipidemia, unspecified: Secondary | ICD-10-CM | POA: Diagnosis not present

## 2019-07-15 DIAGNOSIS — E1165 Type 2 diabetes mellitus with hyperglycemia: Secondary | ICD-10-CM | POA: Diagnosis not present

## 2019-07-15 DIAGNOSIS — K219 Gastro-esophageal reflux disease without esophagitis: Secondary | ICD-10-CM | POA: Diagnosis not present

## 2019-07-15 HISTORY — DX: Other specified abnormal findings of blood chemistry: R79.89

## 2019-07-15 HISTORY — DX: Major depressive disorder, recurrent, in partial remission: F33.41

## 2019-07-15 HISTORY — DX: Other amnesia: R41.3

## 2019-08-07 DIAGNOSIS — I10 Essential (primary) hypertension: Secondary | ICD-10-CM | POA: Diagnosis not present

## 2019-08-07 DIAGNOSIS — Z794 Long term (current) use of insulin: Secondary | ICD-10-CM | POA: Diagnosis not present

## 2019-08-07 DIAGNOSIS — E785 Hyperlipidemia, unspecified: Secondary | ICD-10-CM | POA: Diagnosis not present

## 2019-08-07 DIAGNOSIS — E119 Type 2 diabetes mellitus without complications: Secondary | ICD-10-CM | POA: Diagnosis not present

## 2019-08-12 ENCOUNTER — Other Ambulatory Visit: Payer: Self-pay

## 2019-08-12 MED ORDER — CLOPIDOGREL BISULFATE 75 MG PO TABS: 75.0000 mg | ORAL_TABLET | Freq: Every day | ORAL | 0 refills | Status: DC

## 2019-11-06 ENCOUNTER — Other Ambulatory Visit: Payer: Self-pay | Admitting: Cardiology

## 2019-11-07 ENCOUNTER — Other Ambulatory Visit: Payer: Self-pay | Admitting: *Deleted

## 2019-11-07 MED ORDER — CLOPIDOGREL BISULFATE 75 MG PO TABS: 75.0000 mg | ORAL_TABLET | Freq: Every day | ORAL | 0 refills | Status: DC

## 2019-11-14 ENCOUNTER — Other Ambulatory Visit: Payer: Self-pay | Admitting: Emergency Medicine

## 2019-11-14 NOTE — Telephone Encounter (Signed)
Opened in error

## 2019-12-04 ENCOUNTER — Other Ambulatory Visit: Payer: Self-pay | Admitting: Cardiology

## 2019-12-10 DIAGNOSIS — I10 Essential (primary) hypertension: Secondary | ICD-10-CM | POA: Diagnosis not present

## 2019-12-10 DIAGNOSIS — E785 Hyperlipidemia, unspecified: Secondary | ICD-10-CM | POA: Diagnosis not present

## 2019-12-10 DIAGNOSIS — E1165 Type 2 diabetes mellitus with hyperglycemia: Secondary | ICD-10-CM | POA: Diagnosis not present

## 2019-12-12 ENCOUNTER — Other Ambulatory Visit: Payer: Self-pay | Admitting: Cardiology

## 2019-12-19 DIAGNOSIS — E785 Hyperlipidemia, unspecified: Secondary | ICD-10-CM | POA: Diagnosis not present

## 2019-12-19 DIAGNOSIS — R42 Dizziness and giddiness: Secondary | ICD-10-CM

## 2019-12-19 DIAGNOSIS — E538 Deficiency of other specified B group vitamins: Secondary | ICD-10-CM | POA: Diagnosis not present

## 2019-12-19 DIAGNOSIS — E559 Vitamin D deficiency, unspecified: Secondary | ICD-10-CM | POA: Diagnosis not present

## 2019-12-19 DIAGNOSIS — J309 Allergic rhinitis, unspecified: Secondary | ICD-10-CM | POA: Diagnosis not present

## 2019-12-19 DIAGNOSIS — K219 Gastro-esophageal reflux disease without esophagitis: Secondary | ICD-10-CM | POA: Diagnosis not present

## 2019-12-19 DIAGNOSIS — I10 Essential (primary) hypertension: Secondary | ICD-10-CM | POA: Diagnosis not present

## 2019-12-19 DIAGNOSIS — R413 Other amnesia: Secondary | ICD-10-CM | POA: Diagnosis not present

## 2019-12-19 DIAGNOSIS — F3341 Major depressive disorder, recurrent, in partial remission: Secondary | ICD-10-CM | POA: Diagnosis not present

## 2019-12-19 HISTORY — DX: Dizziness and giddiness: R42

## 2019-12-19 HISTORY — DX: Allergic rhinitis, unspecified: J30.9

## 2019-12-20 DIAGNOSIS — F3341 Major depressive disorder, recurrent, in partial remission: Secondary | ICD-10-CM | POA: Diagnosis not present

## 2019-12-20 DIAGNOSIS — R42 Dizziness and giddiness: Secondary | ICD-10-CM | POA: Diagnosis not present

## 2019-12-20 DIAGNOSIS — E785 Hyperlipidemia, unspecified: Secondary | ICD-10-CM | POA: Diagnosis not present

## 2019-12-20 DIAGNOSIS — E538 Deficiency of other specified B group vitamins: Secondary | ICD-10-CM | POA: Diagnosis not present

## 2019-12-20 DIAGNOSIS — R413 Other amnesia: Secondary | ICD-10-CM | POA: Diagnosis not present

## 2019-12-20 DIAGNOSIS — E559 Vitamin D deficiency, unspecified: Secondary | ICD-10-CM | POA: Diagnosis not present

## 2019-12-20 DIAGNOSIS — E119 Type 2 diabetes mellitus without complications: Secondary | ICD-10-CM | POA: Diagnosis not present

## 2019-12-20 DIAGNOSIS — I10 Essential (primary) hypertension: Secondary | ICD-10-CM | POA: Diagnosis not present

## 2020-01-03 IMAGING — DX DG CHEST 2V
2 series · 2 of 2 positions shown · non-contrast
Comparison: December 27, 2010

CLINICAL DATA: Preoperative cardiac catheterization. Chest pain.
Hypertension.

EXAM:
CHEST - 2 VIEW

[chest pa]
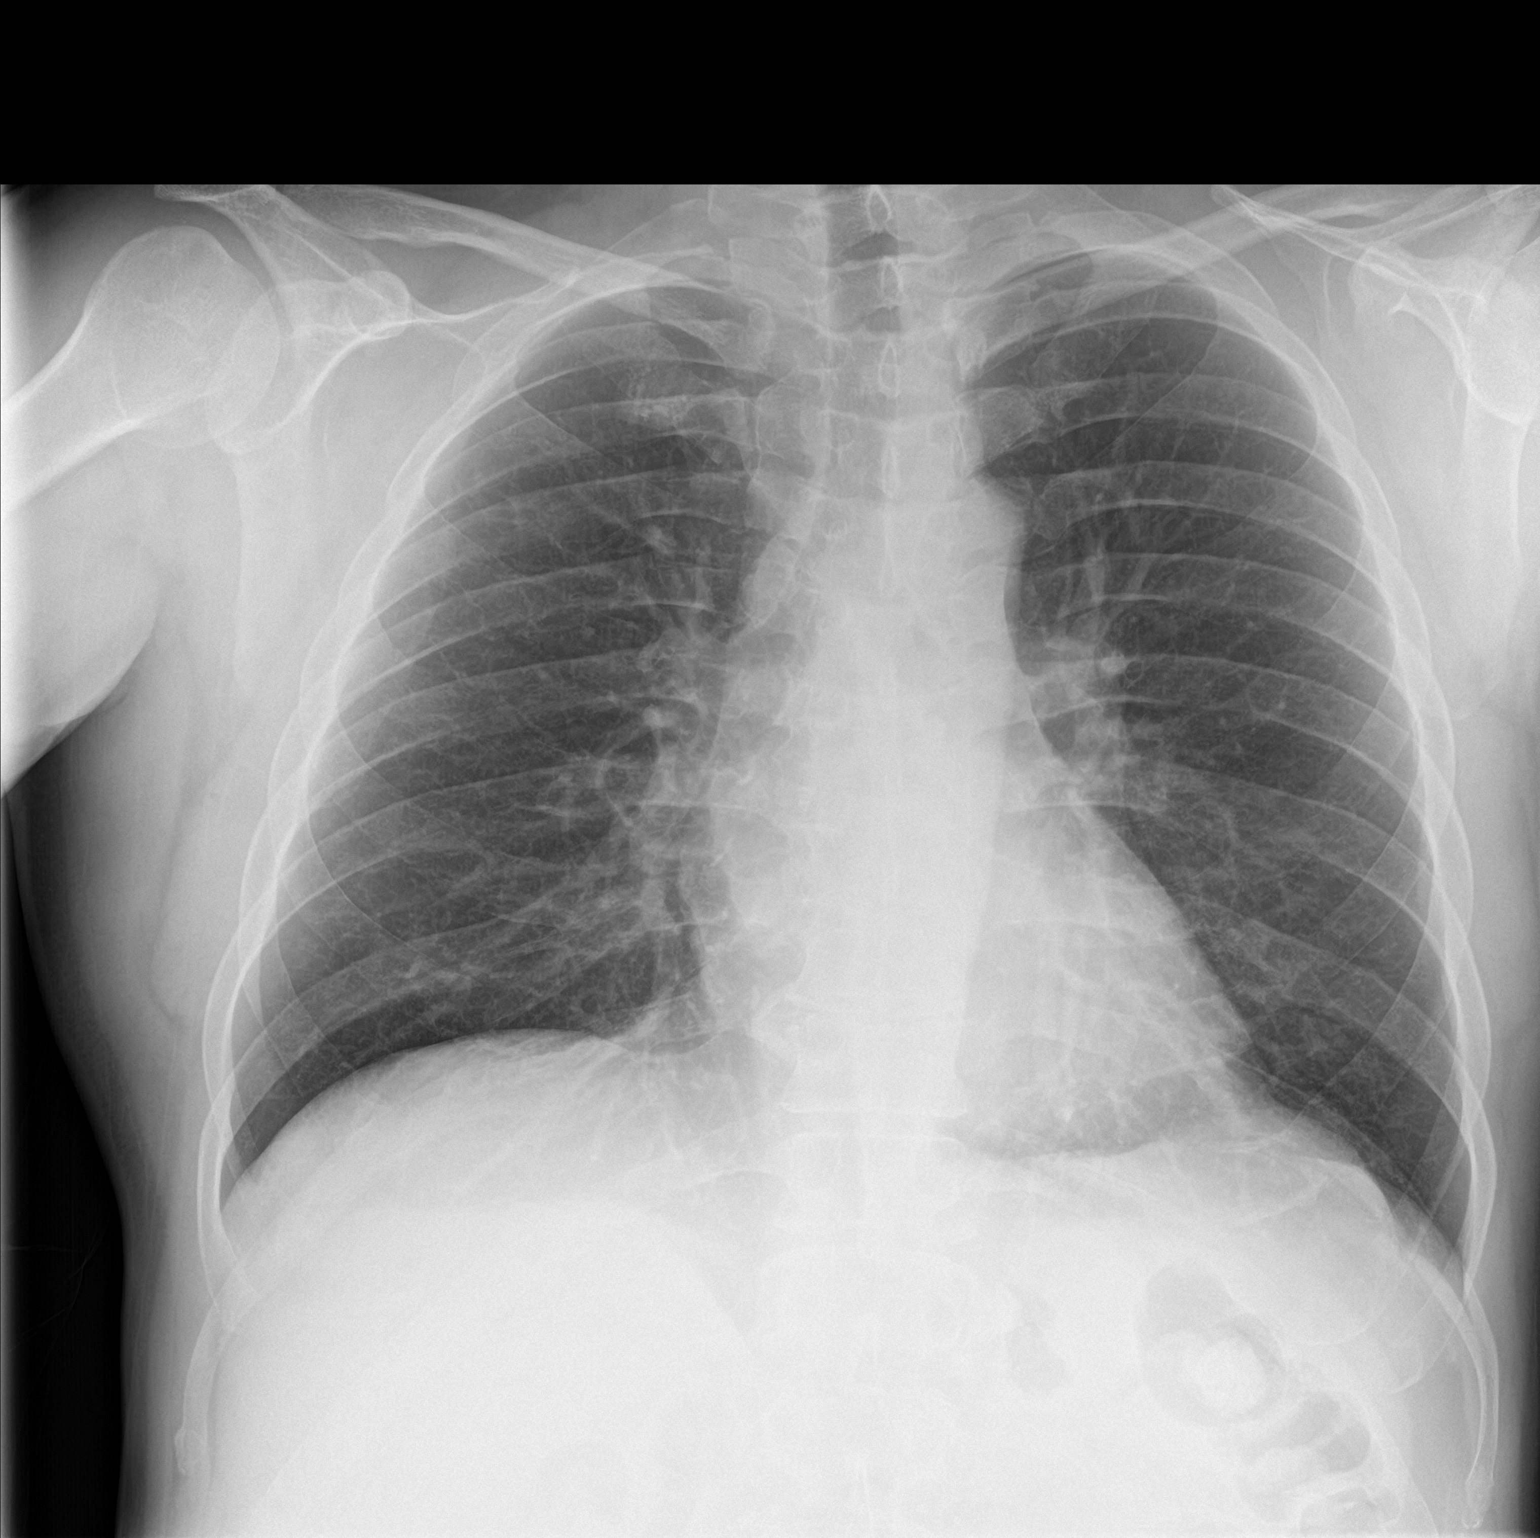

[chest lat]
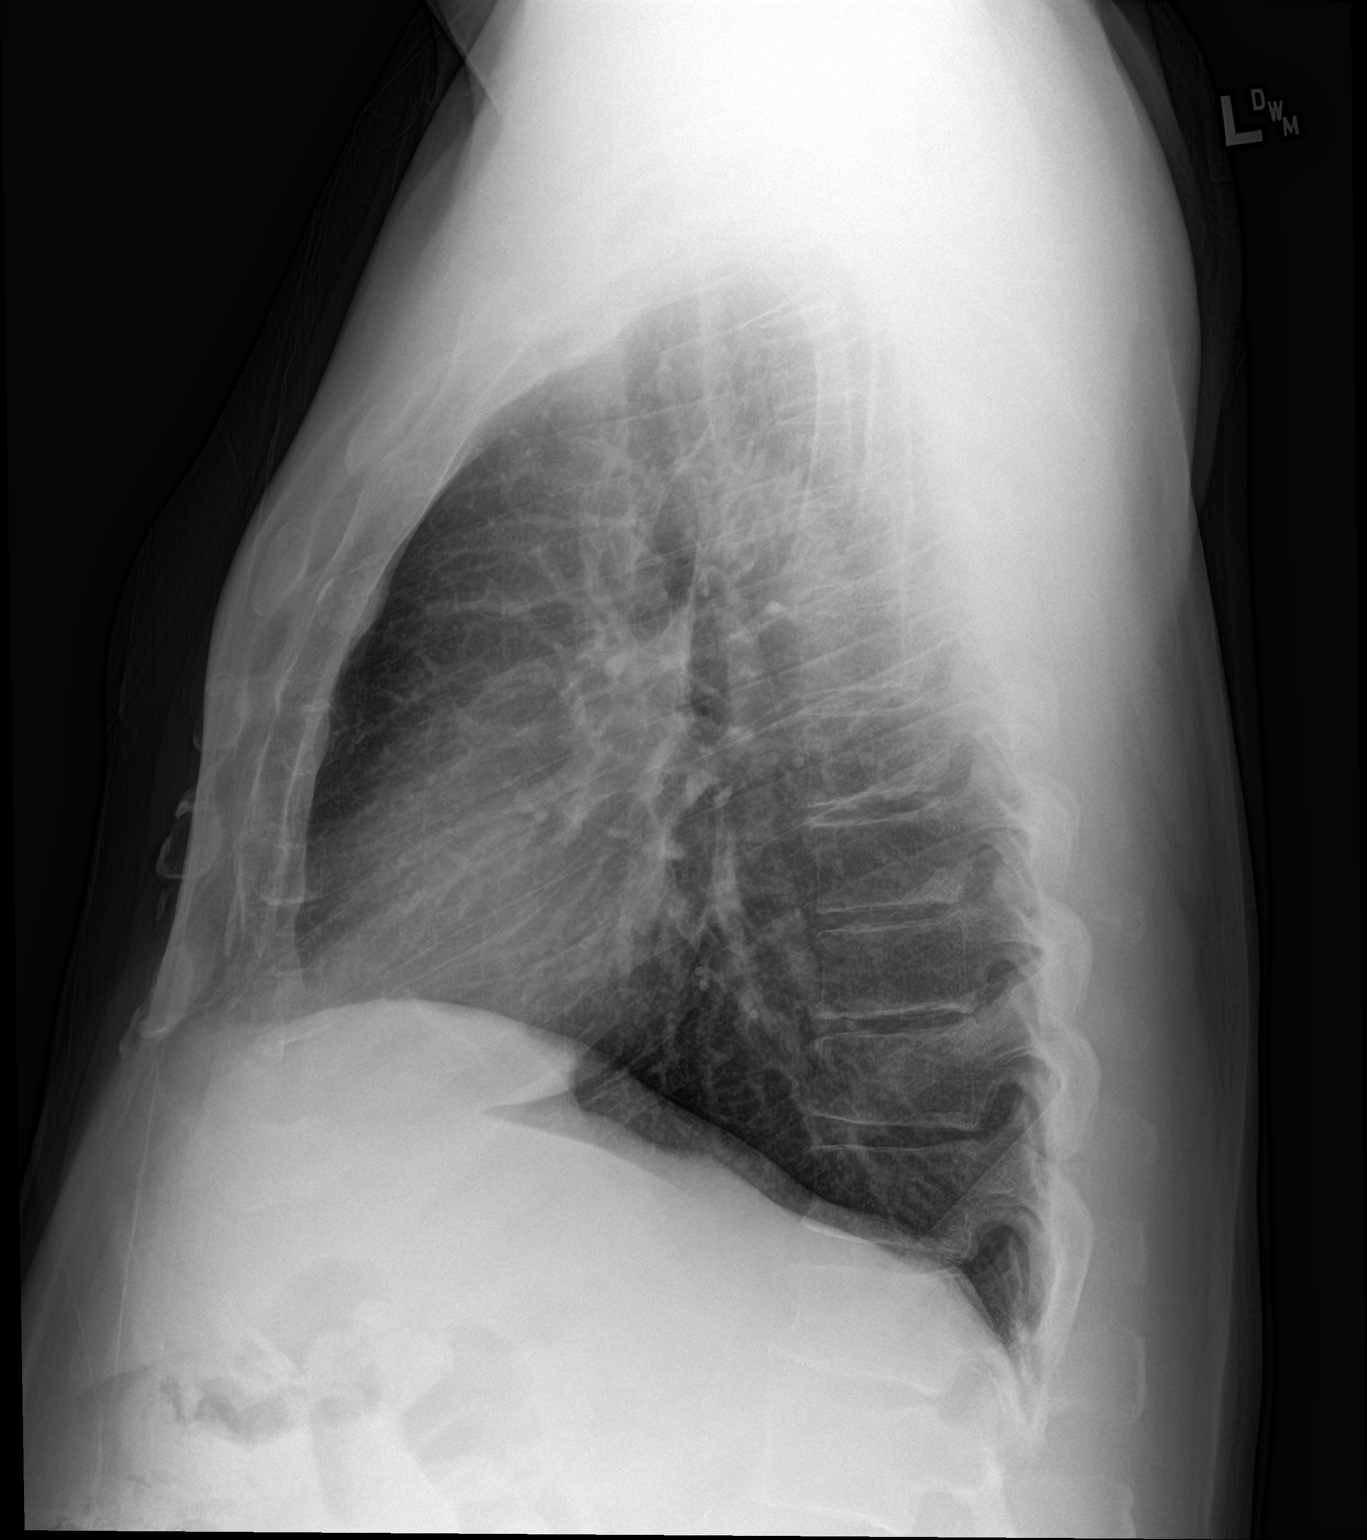

[2 of 2 positions shown; findings below may reference images not displayed]

FINDINGS: No edema or consolidation. Heart size and pulmonary vascularity are
normal. No adenopathy. No pneumothorax. No bone lesions.
IMPRESSION: No edema or consolidation.

## 2020-01-07 DIAGNOSIS — R350 Frequency of micturition: Secondary | ICD-10-CM | POA: Diagnosis not present

## 2020-01-07 DIAGNOSIS — E1165 Type 2 diabetes mellitus with hyperglycemia: Secondary | ICD-10-CM | POA: Diagnosis not present

## 2020-01-07 DIAGNOSIS — E119 Type 2 diabetes mellitus without complications: Secondary | ICD-10-CM | POA: Diagnosis not present

## 2020-01-21 ENCOUNTER — Other Ambulatory Visit: Payer: Self-pay | Admitting: Cardiology

## 2020-02-12 DIAGNOSIS — I10 Essential (primary) hypertension: Secondary | ICD-10-CM | POA: Diagnosis not present

## 2020-02-12 DIAGNOSIS — Z95820 Peripheral vascular angioplasty status with implants and grafts: Secondary | ICD-10-CM | POA: Diagnosis not present

## 2020-02-12 DIAGNOSIS — Z125 Encounter for screening for malignant neoplasm of prostate: Secondary | ICD-10-CM | POA: Diagnosis not present

## 2020-02-12 DIAGNOSIS — Z0000 Encounter for general adult medical examination without abnormal findings: Secondary | ICD-10-CM | POA: Diagnosis not present

## 2020-02-12 DIAGNOSIS — Z7902 Long term (current) use of antithrombotics/antiplatelets: Secondary | ICD-10-CM | POA: Diagnosis not present

## 2020-02-12 DIAGNOSIS — Z794 Long term (current) use of insulin: Secondary | ICD-10-CM | POA: Diagnosis not present

## 2020-02-12 DIAGNOSIS — E1165 Type 2 diabetes mellitus with hyperglycemia: Secondary | ICD-10-CM | POA: Diagnosis not present

## 2020-02-12 DIAGNOSIS — I25118 Atherosclerotic heart disease of native coronary artery with other forms of angina pectoris: Secondary | ICD-10-CM | POA: Diagnosis not present

## 2020-02-14 ENCOUNTER — Other Ambulatory Visit: Payer: Self-pay | Admitting: Cardiology

## 2020-02-24 ENCOUNTER — Other Ambulatory Visit: Payer: Self-pay | Admitting: Cardiology

## 2020-03-18 ENCOUNTER — Other Ambulatory Visit: Payer: Self-pay | Admitting: Cardiology

## 2020-03-26 ENCOUNTER — Other Ambulatory Visit: Payer: Self-pay | Admitting: Cardiology

## 2020-04-01 ENCOUNTER — Telehealth: Payer: Self-pay | Admitting: Cardiology

## 2020-04-01 MED ORDER — EZETIMIBE 10 MG PO TABS: 10.0000 mg | ORAL_TABLET | Freq: Every day | ORAL | 0 refills | Status: DC

## 2020-04-01 NOTE — Telephone Encounter (Signed)
15 day supply sent to pharmacy with note letting the patient know that they must make an appointment to get any further refills.

## 2020-04-01 NOTE — Telephone Encounter (Signed)
New message   Patient needs a new prescription for ezetimibe (ZETIA) 10 MG tablet [id number] [place], [place] - [address]

## 2020-04-10 ENCOUNTER — Other Ambulatory Visit: Payer: Self-pay | Admitting: Cardiology

## 2020-05-12 ENCOUNTER — Ambulatory Visit: Payer: Medicare HMO | Admitting: Cardiology

## 2020-06-17 ENCOUNTER — Other Ambulatory Visit: Payer: Self-pay

## 2020-06-17 ENCOUNTER — Ambulatory Visit: Payer: Medicare HMO | Admitting: Cardiology

## 2020-06-17 ENCOUNTER — Encounter: Payer: Self-pay | Admitting: Cardiology

## 2020-06-17 VITALS — BP 138/78 | HR 91 | Ht 66.0 in | Wt 176.0 lb

## 2020-06-17 DIAGNOSIS — E785 Hyperlipidemia, unspecified: Secondary | ICD-10-CM

## 2020-06-17 DIAGNOSIS — E119 Type 2 diabetes mellitus without complications: Secondary | ICD-10-CM

## 2020-06-17 DIAGNOSIS — I10 Essential (primary) hypertension: Secondary | ICD-10-CM | POA: Diagnosis not present

## 2020-06-17 DIAGNOSIS — I25118 Atherosclerotic heart disease of native coronary artery with other forms of angina pectoris: Secondary | ICD-10-CM | POA: Diagnosis not present

## 2020-06-17 MED ORDER — NITROGLYCERIN 0.4 MG SL SUBL: 0.4000 mg | SUBLINGUAL_TABLET | SUBLINGUAL | 6 refills | Status: DC | PRN

## 2020-06-17 NOTE — Patient Instructions (Signed)
Medication Instructions:  Your physician has recommended you make the following change in your medication:  STOP: Aspirin  *If you need a refill on your cardiac medications before your next appointment, please call your pharmacy*   Lab Work: None If you have labs (blood work) drawn today and your tests are completely normal, you will receive your results only by: . MyChart Message (if you have MyChart) OR . A paper copy in the mail If you have any lab test that is abnormal or we need to change your treatment, we will call you to review the results.   Testing/Procedures: None   Follow-Up: At [facility], you and your health needs are our priority.  As part of our continuing mission to provide you with exceptional heart care, we have created designated Provider Care Teams.  These Care Teams include your primary Cardiologist (physician) and Advanced Practice Providers (APPs -  Physician Assistants and Nurse Practitioners) who all work together to provide you with the care you need, when you need it.  We recommend signing up for the patient portal called "MyChart".  Sign up information is provided on this After Visit Summary.  MyChart is used to connect with patients for Virtual Visits (Telemedicine).  Patients are able to view lab/test results, encounter notes, upcoming appointments, etc.  Non-urgent messages can be sent to your provider as well.   To learn more about what you can do with MyChart, go to [email or web address].    Your next appointment:   1 year(s)  The format for your next appointment:   In Person  Provider:   Brian Munley, MD   Other Instructions   

## 2020-06-17 NOTE — Progress Notes (Signed)
Cardiology Office Note:    Date:  06/17/2020   ID:  [name], DOB Jul 16, 1956, MRN [id number]  PCP:  Hadley Pen, MD  Cardiologist:  Norman Herrlich, MD    Referring MD: Wandra Feinstein, MD    ASSESSMENT:    1. Coronary artery disease of native artery of native heart with stable angina pectoris (HCC)   2. Essential hypertension   3. Dyslipidemia   4. Type 2 diabetes mellitus without complication, without long-term current use of insulin (HCC)    PLAN:    In order of problems listed above:  1. Stable CAD New York Heart Association class I angina on current medical therapy.  He had a normal myocardial perfusion study in the last 2 years.  He is having GI symptoms and will drop aspirin and continue clopidogrel and continue other medical therapy including beta-blocker calcium channel blocker and high intensity statin. 2. Blood pressure target continue current antihypertensives including ACE inhibitor with type 2 diabetes 3. Lipids at target continue high intensity statin plus Zetia 4. Improved his last A1c approaching normal   Next appointment: 1 year   Medication Adjustments/Labs and Tests Ordered: Current medicines are reviewed at length with the patient today.  Concerns regarding medicines are outlined above.  Orders Placed This Encounter  Procedures  . EKG 12-Lead   Meds ordered this encounter  Medications  . nitroGLYCERIN (NITROSTAT) 0.4 MG SL tablet    Sig: Place 1 tablet (0.4 mg total) under the tongue every 5 (five) minutes as needed for chest pain (CALL 911 WITH NO RELIEF IN CHEST PAIN AFTER 3 PILLS.).    Dispense:  25 tablet    Refill:  6    No chief complaint on file.   History of Present Illness:    Nicholas Joseph is a 64 y.o. male with a hx of angina hypertension dyslipidemia CAD and PCI and stent drug-eluting to  mid LAD 07/10/2018  last seen 08/17/2018. Compliance with diet, lifestyle and medications: Yes  He has had pancreatitis and has had  ongoing GI upset GERD takes aspirin and clopidogrel we decided to withdraw aspirin.  His CAD is stable he has had no anginal discomfort but with the recurrence of COVID-19 in the community asked him to fill prescription for nitroglycerin to have at his home to avoid ER visits.  No complaints of chest pain shortness of breath palpitation or syncope and he tolerates his statin without muscle symptoms.  Recent labs from primary care physician 03/27/2020 hemoglobin A1c elevated 7.4% 02/12/2020 lipid profile cholesterol 113 LDL 70 triglycerides 77 HDL 41, CMP normal except for random glucose 135 potassium 4.5 creatinine 1.12 GFR 69 cc and normal liver function test  Since his last visit he has had cardiac testing and hospitalization. I independently reviewed his EKG 12/14/2018 sinus rhythm normal.  He had an echocardiogram done same day showing normal left ventricular size systolic function and there is no valvular abnormality the indication was chest pain.  He also underwent a pharmacologic myocardial perfusion study 12/16/2019 which showed normal perfusion normal function and felt to be low risk. The discharge summary from 12/14/2020 12/15/2018 list principal diagnosis is gastroesophageal reflux disease.  He is troponin assays were undetectable at the hospital EKGs did not show ischemic changes.  Laboratory test showed a normal CBC hemoglobin 12.7 white count 8200 creatinine 1.0 potassium 3.7 blood sugar was elevated in the hospital the range of 196%. Past Medical History:  Diagnosis Date  . Anxiety and  depression 01/22/2016  . Benign non-nodular prostatic hyperplasia with lower urinary tract symptoms 01/22/2016  . Chest pain 05/01/2018  . Chest pain in adult 06/11/2018  . Coronary artery disease   . Degenerative lumbar spinal stenosis 01/22/2016  . Dyslipidemia 01/22/2016  . Gastroesophageal reflux disease without esophagitis 01/22/2016  . History of kidney stones   . Hypertension 01/22/2016  . Neck pain  07/17/2017  . Paresthesia and pain of both upper extremities 07/17/2017  . S/P angioplasty with stent 07/10/2018 07/11/2018  . Type 2 diabetes mellitus without complication, without long-term current use of insulin (HCC) 01/22/2016  . Wheezing on right side of chest on exhalation 07/17/2017    Past Surgical History:  Procedure Laterality Date  . BACK SURGERY    . CORONARY STENT INTERVENTION N/A 07/10/2018   Procedure: CORONARY STENT INTERVENTION;  Surgeon: Corky Crafts, MD;  Location: [facility] INVASIVE CV LAB;  Service: Cardiovascular;  Laterality: N/A;  . HERNIA REPAIR    . LEFT HEART CATH AND CORONARY ANGIOGRAPHY N/A 07/10/2018   Procedure: LEFT HEART CATH AND CORONARY ANGIOGRAPHY;  Surgeon: Corky Crafts, MD;  Location: [facility] INVASIVE CV LAB;  Service: Cardiovascular;  Laterality: N/A;  . LITHOTRIPSY      Current Medications: Current Meds  Medication Sig  . Blood Glucose Monitoring Suppl (GLUCOCOM BLOOD GLUCOSE MONITOR) DEVI See admin instructions.  . carvedilol (COREG) 3.125 MG tablet Take 1 tablet by mouth daily.  Marland Kitchen glimepiride (AMARYL) 4 MG tablet Take 1 tablet by mouth daily.  . insulin degludec (TRESIBA FLEXTOUCH) 100 UNIT/ML FlexTouch Pen Inject 30 units once a day with additional as needed. MDD: 50 units/day  . Insulin Pen Needle (GLOBAL EASY GLIDE PEN NEEDLES) 32G X 4 MM MISC Use as needed for 1 injection daily  . [DISCONTINUED] JANUVIA 50 MG tablet Take 50 mg by mouth daily.     Allergies:   Patient has no known allergies.   Social History   Socioeconomic History  . Marital status: Married    Spouse name: Not on file  . Number of children: Not on file  . Years of education: Not on file  . Highest education level: Not on file  Occupational History  . Not on file  Tobacco Use  . Smoking status: Former Smoker    Types: Cigars  . Smokeless tobacco: Former Clinical biochemist  . Vaping Use: Never used  Substance and Sexual Activity  . Alcohol use: Not Currently  .  Drug use: Not Currently  . Sexual activity: Not on file  Other Topics Concern  . Not on file  Social History Narrative  . Not on file   Social Determinants of Health   Financial Resource Strain:   . Difficulty of Paying Living Expenses:   Food Insecurity:   . Worried About Programme researcher, broadcasting/film/video in the Last Year:   . Barista in the Last Year:   Transportation Needs:   . Freight forwarder (Medical):   Marland Kitchen Lack of Transportation (Non-Medical):   Physical Activity:   . Days of Exercise per Week:   . Minutes of Exercise per Session:   Stress:   . Feeling of Stress :   Social Connections:   . Frequency of Communication with Friends and Family:   . Frequency of Social Gatherings with Friends and Family:   . Attends Religious Services:   . Active Member of Clubs or Organizations:   . Attends Banker Meetings:   Marland Kitchen Marital  Status:      Family History: The patient's family history includes Diabetes in his maternal grandmother; Heart attack in his father; Hypertension in his father; Kidney failure in his mother. ROS:   Please see the history of present illness.    All other systems reviewed and are negative.  EKGs/Labs/Other Studies Reviewed:    The following studies were reviewed today:  See history I reviewed his records including EKGs and labs  EKG my office today shows sinus rhythm and is normal  Physical Exam:    VS:  BP (!) 138/78 (BP Location: Left Arm)   Pulse 91   Ht 5\' 6"  (1.676 m)   Wt 176 lb (79.8 kg)   SpO2 96%   BMI 28.41 kg/m     Wt Readings from Last 3 Encounters:  06/17/20 176 lb (79.8 kg)  08/17/18 180 lb 3.2 oz (81.7 kg)  07/17/18 180 lb 12.8 oz (82 kg)     GEN:  Well nourished, well developed in no acute distress HEENT: Normal NECK: No JVD; No carotid bruits LYMPHATICS: No lymphadenopathy CARDIAC: RRR, no murmurs, rubs, gallops RESPIRATORY:  Clear to auscultation without rales, wheezing or rhonchi  ABDOMEN: Soft,  non-tender, non-distended MUSCULOSKELETAL:  No edema; No deformity  SKIN: Warm and dry NEUROLOGIC:  Alert and oriented x 3 PSYCHIATRIC:  Normal affect    Signed, 07/19/18, MD  06/17/2020 10:16 AM    [facility]

## 2020-12-16 ENCOUNTER — Telehealth: Payer: Self-pay | Admitting: Cardiology

## 2020-12-16 NOTE — Telephone Encounter (Signed)
Patient calling the office for samples of medication:   1.  What medication and dosage are you requesting samples for?   DULoxetine (CYMBALTA) 60 MG capsule    2.  Are you currently out of this medication? Yes   Patient's wife states the prescription was never changed to two tablets daily so he is out. She states his insurance will not pay for the refill, until February. She states if he does not take the medication he gets very depressed, and he is crying right now.

## 2020-12-17 NOTE — Telephone Encounter (Signed)
This medication was never prescribed here and notes don't indicate Dr. Munley resuming with prescribing this med. I called Ms. Taplin and left a message to return my call.  

## 2020-12-17 NOTE — Telephone Encounter (Signed)
We don't have that mediation here, it is for anxiety and depression so we don't prescribe it.

## 2020-12-17 NOTE — Telephone Encounter (Signed)
This medication was never prescribed here and notes don't indicate Dr. Dulce Sellar resuming with prescribing this med. I called Nicholas Joseph and left a message to return my call.

## 2020-12-25 NOTE — Telephone Encounter (Signed)
I called the patient and left another message to return my call. I sent a letter advising to please call back. I will await patient response

## 2020-12-25 NOTE — Telephone Encounter (Signed)
I called the patient and left another message to return my call. I sent a letter advising to please call back. I will await patient response 

## 2021-07-06 DIAGNOSIS — M5416 Radiculopathy, lumbar region: Secondary | ICD-10-CM

## 2021-07-06 HISTORY — DX: Radiculopathy, lumbar region: M54.16

## 2021-07-14 DIAGNOSIS — Z87442 Personal history of urinary calculi: Secondary | ICD-10-CM | POA: Insufficient documentation

## 2021-07-21 ENCOUNTER — Encounter: Payer: Self-pay | Admitting: Cardiology

## 2021-07-21 ENCOUNTER — Ambulatory Visit: Payer: Medicare HMO | Admitting: Cardiology

## 2021-07-21 ENCOUNTER — Other Ambulatory Visit: Payer: Self-pay

## 2021-07-21 VITALS — BP 126/76 | HR 91 | Ht 66.0 in | Wt 162.0 lb

## 2021-07-21 DIAGNOSIS — E785 Hyperlipidemia, unspecified: Secondary | ICD-10-CM

## 2021-07-21 DIAGNOSIS — I10 Essential (primary) hypertension: Secondary | ICD-10-CM | POA: Diagnosis not present

## 2021-07-21 DIAGNOSIS — I25118 Atherosclerotic heart disease of native coronary artery with other forms of angina pectoris: Secondary | ICD-10-CM

## 2021-07-21 NOTE — Patient Instructions (Signed)
Medication Instructions:  Your physician recommends that you continue on your current medications as directed. Please refer to the Current Medication list given to you today.  Please take two capsules of over the counter fish oil daily.  *If you need a refill on your cardiac medications before your next appointment, please call your pharmacy*   Lab Work: None If you have labs (blood work) drawn today and your tests are completely normal, you will receive your results only by: MyChart Message (if you have MyChart) OR A paper copy in the mail If you have any lab test that is abnormal or we need to change your treatment, we will call you to review the results.   Testing/Procedures: None   Follow-Up: At [facility], you and your health needs are our priority.  As part of our continuing mission to provide you with exceptional heart care, we have created designated Provider Care Teams.  These Care Teams include your primary Cardiologist (physician) and Advanced Practice Providers (APPs -  Physician Assistants and Nurse Practitioners) who all work together to provide you with the care you need, when you need it.  We recommend signing up for the patient portal called "MyChart".  Sign up information is provided on this After Visit Summary.  MyChart is used to connect with patients for Virtual Visits (Telemedicine).  Patients are able to view lab/test results, encounter notes, upcoming appointments, etc.  Non-urgent messages can be sent to your provider as well.   To learn more about what you can do with MyChart, go to [email or web address].    Your next appointment:   1 year(s)  The format for your next appointment:   In Person  Provider:   Norman Herrlich, MD   Other Instructions

## 2021-07-21 NOTE — Progress Notes (Signed)
Cardiology Office Note:    Date:  07/21/2021   ID:  [name], DOB 02-Jun-1956, MRN [id number]  PCP:  Hadley Pen, MD  Cardiologist:  Norman Herrlich, MD    Referring MD: Hadley Pen, MD    ASSESSMENT:    1. Coronary artery disease of native artery of native heart with stable angina pectoris (HCC)   2. Essential hypertension   3. Dyslipidemia    PLAN:    In order of problems listed above:  He is doing well with CAD remains asymptomatic no angina following PCI and a current medical treatment including clopidogrel beta-blocker and lipid-lowering with a high intensity statin fenofibrate and Zetia.  He inquires about his triglycerides asked him to take over-the-counter fish 2 capsules daily I feel comfortable getting target of 150 if not we can use prescription Lovaza for icosapent ethyl.  At this time I would not advise an ischemic evaluation Blood pressure target continue his current treatment including ACE inhibitor calcium channel blocker Continue current treatment statin and fibrate Zetia and add over-the-counter fish oil Stable diabetes managed by his PCP he has not taken SGLT2 inhibitor.   Next appointment: 1   Medication Adjustments/Labs and Tests Ordered: Current medicines are reviewed at length with the patient today.  Concerns regarding medicines are outlined above.  Orders Placed This Encounter  Procedures   EKG 12-Lead    No orders of the defined types were placed in this encounter.   Chief complaint: Follow-up for CAD   History of Present Illness:    Nicholas Joseph is a 65 y.o. male with a hx of CAD with PCI and drug-eluting stent to the mid LAD since 07/10/2018 hypertension and dyslipidemia.  He also has a history of previous pancreatitis and gastroesophageal reflux disease.  He was last seen 06/17/2020.  Recent labs from 06/01/2021 hemoglobin A1c 7.1% 01/25/2021 cholesterol 147 triglycerides 189 HDL 38 sodium 138 potassium 4.4 creatinine 1.09  LDL cholesterol 90 hemoglobin 13.9  Compliance with diet, lifestyle and medications: Yes  He is active golfs on a regular basis pleased with the quality of his life has no angina shortness of breath chest pain palpitation or syncope.  At times he notices mild bronchospasm has not needed an inhaler.  He has not required nitroglycerin and tolerates lipid-lowering with fenofibric statin and Zetia without muscle symptoms Past Medical History:  Diagnosis Date   Anxiety and depression 01/22/2016   Benign non-nodular prostatic hyperplasia with lower urinary tract symptoms 01/22/2016   Chest pain 05/01/2018   Chest pain in adult 06/11/2018   Coronary artery disease    Degenerative lumbar spinal stenosis 01/22/2016   Dyslipidemia 01/22/2016   Gastroesophageal reflux disease without esophagitis 01/22/2016   History of kidney stones    Hypertension 01/22/2016   Neck pain 07/17/2017   Paresthesia and pain of both upper extremities 07/17/2017   S/P angioplasty with stent 07/10/2018 07/11/2018   Type 2 diabetes mellitus without complication, without long-term current use of insulin (HCC) 01/22/2016   Wheezing on right side of chest on exhalation 07/17/2017    Past Surgical History:  Procedure Laterality Date   BACK SURGERY     CORONARY STENT INTERVENTION N/A 07/10/2018   Procedure: CORONARY STENT INTERVENTION;  Surgeon: Corky Crafts, MD;  Location: [facility] INVASIVE CV LAB;  Service: Cardiovascular;  Laterality: N/A;   HERNIA REPAIR     LEFT HEART CATH AND CORONARY ANGIOGRAPHY N/A 07/10/2018   Procedure: LEFT HEART CATH AND CORONARY ANGIOGRAPHY;  Surgeon:  Corky Crafts, MD;  Location: [facility] INVASIVE CV LAB;  Service: Cardiovascular;  Laterality: N/A;   LITHOTRIPSY      Current Medications: Current Meds  Medication Sig   amLODipine (NORVASC) 10 MG tablet Take 10 mg by mouth daily.   atorvastatin (LIPITOR) 40 MG tablet Take 40 mg by mouth daily.   bisoprolol-hydrochlorothiazide (ZIAC) 5-6.25 MG tablet Take 1  tablet by mouth daily.    Blood Glucose Monitoring Suppl (GLUCOCOM BLOOD GLUCOSE MONITOR) DEVI See admin instructions.   carvedilol (COREG) 3.125 MG tablet Take 1 tablet by mouth daily.   clopidogrel (PLAVIX) 75 MG tablet TAKE 1 TABLET (75 MG TOTAL) BY MOUTH DAILY WITH BREAKFAST.   dapagliflozin propanediol (FARXIGA) 10 MG TABS tablet Take 1 tablet by mouth daily.   DULoxetine (CYMBALTA) 60 MG capsule Take 60 mg by mouth daily.   ezetimibe (ZETIA) 10 MG tablet TAKE 1 TABLET (10 MG TOTAL) BY MOUTH DAILY. MUST SCHEDULE APPOINTMENT FOR FURTHER REFILLS   fenofibrate 160 MG tablet Take 160 mg by mouth daily.   fluticasone (FLONASE) 50 MCG/ACT nasal spray Place 2 sprays into both nostrils daily.    glimepiride (AMARYL) 4 MG tablet Take 1 tablet by mouth daily.   insulin degludec (TRESIBA FLEXTOUCH) 100 UNIT/ML FlexTouch Pen Inject 30 units once a day with additional as needed. MDD: 50 units/day   Insulin Pen Needle (GLOBAL EASY GLIDE PEN NEEDLES) 32G X 4 MM MISC Use as needed for 1 injection daily   lisinopril (PRINIVIL,ZESTRIL) 40 MG tablet Take 40 mg by mouth daily.   loratadine (CLARITIN) 10 MG tablet Take 10 mg by mouth daily.   metFORMIN (GLUCOPHAGE) 1000 MG tablet Take 1,000 mg by mouth 2 (two) times daily with a meal.    nitroGLYCERIN (NITROSTAT) 0.4 MG SL tablet Place 1 tablet (0.4 mg total) under the tongue every 5 (five) minutes as needed for chest pain (CALL 911 WITH NO RELIEF IN CHEST PAIN AFTER 3 PILLS.).     Allergies:   Patient has no known allergies.   Social History   Socioeconomic History   Marital status: Married    Spouse name: Not on file   Number of children: Not on file   Years of education: Not on file   Highest education level: Not on file  Occupational History   Not on file  Tobacco Use   Smoking status: Former    Types: Cigars   Smokeless tobacco: Former  Building services engineer Use: Never used  Substance and Sexual Activity   Alcohol use: Not Currently   Drug  use: Not Currently   Sexual activity: Not on file  Other Topics Concern   Not on file  Social History Narrative   Not on file   Social Determinants of Health   Financial Resource Strain: Not on file  Food Insecurity: Not on file  Transportation Needs: Not on file  Physical Activity: Not on file  Stress: Not on file  Social Connections: Not on file     Family History: The patient's family history includes Diabetes in his maternal grandmother; Heart attack in his father; Hypertension in his father; Kidney failure in his mother. ROS:   Please see the history of present illness.    All other systems reviewed and are negative.  EKGs/Labs/Other Studies Reviewed:    The following studies were reviewed today:  EKG:  EKG ordered today and personally reviewed.  The ekg ordered today demonstrates sinus rhythm and is normal  Physical Exam:    VS:  BP 126/76 (BP Location: Right Arm, Patient Position: Sitting, Cuff Size: Normal)   Pulse 91   Ht 5\' 6"  (1.676 m)   Wt 162 lb 0.6 oz (73.5 kg)   SpO2 97%   BMI 26.15 kg/m     Wt Readings from Last 3 Encounters:  07/21/21 162 lb 0.6 oz (73.5 kg)  06/17/20 176 lb (79.8 kg)  08/17/18 180 lb 3.2 oz (81.7 kg)     GEN:  Well nourished, well developed in no acute distress HEENT: Normal NECK: No JVD; No carotid bruits LYMPHATICS: No lymphadenopathy CARDIAC: RRR, no murmurs, rubs, gallops RESPIRATORY:  Clear to auscultation without rales, wheezing or rhonchi  ABDOMEN: Soft, non-tender, non-distended MUSCULOSKELETAL:  No edema; No deformity  SKIN: Warm and dry NEUROLOGIC:  Alert and oriented x 3 PSYCHIATRIC:  Normal affect    Signed, 08/19/18, MD  07/21/2021 9:02 AM    [facility]

## 2021-07-28 DIAGNOSIS — E559 Vitamin D deficiency, unspecified: Secondary | ICD-10-CM

## 2021-07-28 DIAGNOSIS — E538 Deficiency of other specified B group vitamins: Secondary | ICD-10-CM | POA: Insufficient documentation

## 2021-07-28 HISTORY — DX: Deficiency of other specified B group vitamins: E53.8

## 2021-07-28 HISTORY — DX: Hypomagnesemia: E83.42

## 2021-07-28 HISTORY — DX: Vitamin D deficiency, unspecified: E55.9

## 2022-08-23 ENCOUNTER — Telehealth: Payer: Self-pay

## 2022-08-23 NOTE — Telephone Encounter (Signed)
...     Pre-operative Risk Assessment    Patient Name: Nicholas Joseph  DOB: May 26, 1956 MRN: [id number]      Request for Surgical Clearance    Procedure:   COLONOSCOPY  Date of Surgery:  Clearance TBD                                 Surgeon:  Melynda Ripple Surgeon's Group or Practice Name:  [facility] Phone number:  [phone] Fax number:  [phone]   Type of Clearance Requested:  PATIENT IS TAKING PLAVIX, PLEASE ADVISE WHEN TO STOP    Type of Anesthesia:  General : PROPOFOL   Additional requests/questions:    Gwenlyn Found   08/23/2022, 12:41 PM

## 2022-08-23 NOTE — Telephone Encounter (Signed)
Primary Cardiologist:Brian Bettina Gavia, MD  Chart reviewed as part of pre-operative protocol coverage. Because of Nicholas Joseph past medical history and time since last visit, he/she will require a follow-up visit in order to better assess preoperative cardiovascular risk.  Pre-op covering staff: - Please schedule appointment and call patient to inform them. He is overdue for follow-up. - Please contact requesting surgeon's office via preferred method (i.e, phone, fax) to inform them of need for appointment prior to surgery.  History of CAD with PCI/DES to [facility] 06/2018. If no evidence of ACS, he may hold Plavix for 5 days prior to procedure.   Emmaline Life, NP-C  08/23/2022, 12:59 PM [address], [address] Office [phone] Fax [phone]

## 2022-08-26 NOTE — Telephone Encounter (Signed)
Pt has appt 10/26/22 with Dr Bettina Gavia.

## 2022-09-19 ENCOUNTER — Ambulatory Visit: Payer: Medicare HMO | Admitting: Cardiology

## 2022-10-25 NOTE — Progress Notes (Unsigned)
Cardiology Office Note:    Date:  10/26/2022   ID:  Nicholas Joseph, DOB 10-Nov-1956, MRN [id number]  PCP:  Hadley Pen, MD  Cardiologist:  Norman Herrlich, MD    Referring MD: Hadley Pen, MD    ASSESSMENT:    1. Coronary artery disease of native artery of native heart with stable angina pectoris (HCC)   2. Essential hypertension   3. Dyslipidemia    PLAN:    In order of problems listed above:  He has chronic coronary artery disease stable for years remote from PCI and stent doing well having no angina he can withhold his clopidogrel week prior 1 day after colonoscopy note sent to the physician.  Continue his high intensity statin lipids are at target along with Zetia and his beta-blocker. Well-controlled home blood pressure runs less than 120/75 Continue combined high intensity statin fenofibrate and Zetia   Next appointment: 1 year   Medication Adjustments/Labs and Tests Ordered: Current medicines are reviewed at length with the patient today.  Concerns regarding medicines are outlined above.  No orders of the defined types were placed in this encounter.  No orders of the defined types were placed in this encounter.    Chief complaint follow-up CAD pending colonoscopy whether he can stop Plavix   History of Present Illness:    Nicholas Joseph is a 66 y.o. male with a hx of CAD with PCI and drug-eluting stent to the LAD 07/10/2018 hypertension dyslipidemia last seen by me 07/21/2021.  He is seen today for preoperative evaluation prior to colonoscopy.  Compliance with diet, lifestyle and medications: Yes  He is scheduled for colonoscopy and has had a change in his bowel habit with constipation he is on clopidogrel and I told him to hold it 1 week prior and generally resume 24 hours later unless instructed by the physician hold it longer.  He is now 4 years remote from his PCI and stent.  He is having no angina edema shortness of breath palpitation or syncope.  He  tolerates his lipid-lowering therapy without muscle pain or weakness and has had no clinical bleeding  Unfortunately 6 months ago he fell landed on his buttocks and is having severe chronic low back pain.  I told him this is not typical and he needs to have follow-up and he may well of had a compression fracture or spine injury.  Recent labs 07/29/2022 total cholesterol 124 LDL 63 non-HDL cholesterol 82 triglycerides 203 HDL 42 CMP with potassium 4.3 sodium 139 creatinine 1.17 GFR 69 cc/min normal liver function test Past Medical History:  Diagnosis Date   Anxiety and depression 01/22/2016   Benign non-nodular prostatic hyperplasia with lower urinary tract symptoms 01/22/2016   Chest pain 05/01/2018   Chest pain in adult 06/11/2018   Coronary artery disease    Degenerative lumbar spinal stenosis 01/22/2016   Dyslipidemia 01/22/2016   Gastroesophageal reflux disease without esophagitis 01/22/2016   History of kidney stones    Hypertension 01/22/2016   Neck pain 07/17/2017   Paresthesia and pain of both upper extremities 07/17/2017   S/P angioplasty with stent 07/10/2018 07/11/2018   Type 2 diabetes mellitus without complication, without long-term current use of insulin (HCC) 01/22/2016   Wheezing on right side of chest on exhalation 07/17/2017    Past Surgical History:  Procedure Laterality Date   BACK SURGERY     CORONARY STENT INTERVENTION N/A 07/10/2018   Procedure: CORONARY STENT INTERVENTION;  Surgeon: Corky Crafts, MD;  Location: [facility] INVASIVE CV LAB;  Service: Cardiovascular;  Laterality: N/A;   HERNIA REPAIR     LEFT HEART CATH AND CORONARY ANGIOGRAPHY N/A 07/10/2018   Procedure: LEFT HEART CATH AND CORONARY ANGIOGRAPHY;  Surgeon: Corky Crafts, MD;  Location: [facility] INVASIVE CV LAB;  Service: Cardiovascular;  Laterality: N/A;   LITHOTRIPSY      Current Medications: Current Meds  Medication Sig   amLODipine (NORVASC) 10 MG tablet Take 10 mg by mouth daily.   atorvastatin (LIPITOR) 40  MG tablet Take 40 mg by mouth daily.   bisoprolol-hydrochlorothiazide (ZIAC) 5-6.25 MG tablet Take 1 tablet by mouth daily.    Blood Glucose Monitoring Suppl (GLUCOCOM BLOOD GLUCOSE MONITOR) DEVI See admin instructions.   carvedilol (COREG) 3.125 MG tablet Take 1 tablet by mouth daily.   clopidogrel (PLAVIX) 75 MG tablet TAKE 1 TABLET (75 MG TOTAL) BY MOUTH DAILY WITH BREAKFAST.   dapagliflozin propanediol (FARXIGA) 10 MG TABS tablet Take 1 tablet by mouth daily.   DULoxetine (CYMBALTA) 60 MG capsule Take 60 mg by mouth daily.   ezetimibe (ZETIA) 10 MG tablet TAKE 1 TABLET (10 MG TOTAL) BY MOUTH DAILY. MUST SCHEDULE APPOINTMENT FOR FURTHER REFILLS   fenofibrate 160 MG tablet Take 160 mg by mouth daily.   fluticasone (FLONASE) 50 MCG/ACT nasal spray Place 2 sprays into both nostrils daily.    glimepiride (AMARYL) 4 MG tablet Take 1 tablet by mouth daily.   insulin degludec (TRESIBA FLEXTOUCH) 100 UNIT/ML FlexTouch Pen Inject 30 units once a day with additional as needed. MDD: 50 units/day   Insulin Pen Needle (GLOBAL EASY GLIDE PEN NEEDLES) 32G X 4 MM MISC Use as needed for 1 injection daily   lisinopril (PRINIVIL,ZESTRIL) 40 MG tablet Take 40 mg by mouth daily.   loratadine (CLARITIN) 10 MG tablet Take 10 mg by mouth daily.   metFORMIN (GLUCOPHAGE) 1000 MG tablet Take 1,000 mg by mouth 2 (two) times daily with a meal.    nitroGLYCERIN (NITROSTAT) 0.4 MG SL tablet Place 1 tablet (0.4 mg total) under the tongue every 5 (five) minutes as needed for chest pain (CALL 911 WITH NO RELIEF IN CHEST PAIN AFTER 3 PILLS.).     Allergies:   Patient has no known allergies.   Social History   Socioeconomic History   Marital status: Married    Spouse name: Not on file   Number of children: Not on file   Years of education: Not on file   Highest education level: Not on file  Occupational History   Not on file  Tobacco Use   Smoking status: Former    Types: Cigars   Smokeless tobacco: Former  Water quality scientist Use: Never used  Substance and Sexual Activity   Alcohol use: Not Currently   Drug use: Not Currently   Sexual activity: Not on file  Other Topics Concern   Not on file  Social History Narrative   Not on file   Social Determinants of Health   Financial Resource Strain: Not on file  Food Insecurity: Not on file  Transportation Needs: Not on file  Physical Activity: Not on file  Stress: Not on file  Social Connections: Not on file     Family History: The patient's family history includes Diabetes in his maternal grandmother; Heart attack in his father; Hypertension in his father; Kidney failure in his mother. ROS:   Please see the history of present illness.    All other systems reviewed and  are negative.  EKGs/Labs/Other Studies Reviewed:    The following studies were reviewed today:  EKG:  EKG ordered today and personally reviewed.  The ekg ordered today demonstrates sinus rhythm artifact is present but the EKG is otherwise normal  Recent Labs: See history  Physical Exam:    VS:  BP (!) 154/74 (BP Location: Right Arm, Patient Position: Sitting, Cuff Size: Normal)   Pulse 91   Ht 5\' 6"  (1.676 m)   Wt 170 lb (77.1 kg)   SpO2 97%   BMI 27.44 kg/m     Wt Readings from Last 3 Encounters:  10/26/22 170 lb (77.1 kg)  07/21/21 162 lb 0.6 oz (73.5 kg)  06/17/20 176 lb (79.8 kg)     GEN:  Well nourished, well developed in no acute distress HEENT: Normal NECK: No JVD; No carotid bruits LYMPHATICS: No lymphadenopathy CARDIAC: RRR, no murmurs, rubs, gallops RESPIRATORY:  Clear to auscultation without rales, wheezing or rhonchi  ABDOMEN: Soft, non-tender, non-distended MUSCULOSKELETAL:  No edema; No deformity  SKIN: Warm and dry NEUROLOGIC:  Alert and oriented x 3 PSYCHIATRIC:  Normal affect    Signed, 06/19/20, MD  10/26/2022 1:49 PM    [facility]

## 2022-10-26 ENCOUNTER — Encounter: Payer: Self-pay | Admitting: Cardiology

## 2022-10-26 ENCOUNTER — Ambulatory Visit: Payer: Medicare HMO | Attending: Cardiology | Admitting: Cardiology

## 2022-10-26 VITALS — BP 142/86 | HR 91 | Ht 66.0 in | Wt 170.0 lb

## 2022-10-26 DIAGNOSIS — I10 Essential (primary) hypertension: Secondary | ICD-10-CM

## 2022-10-26 DIAGNOSIS — I25118 Atherosclerotic heart disease of native coronary artery with other forms of angina pectoris: Secondary | ICD-10-CM

## 2022-10-26 DIAGNOSIS — E785 Hyperlipidemia, unspecified: Secondary | ICD-10-CM

## 2022-10-26 NOTE — Patient Instructions (Signed)
Medication Instructions:  Your physician recommends that you continue on your current medications as directed. Please refer to the Current Medication list given to you today.  *If you need a refill on your cardiac medications before your next appointment, please call your pharmacy*   Lab Work: None If you have labs (blood work) drawn today and your tests are completely normal, you will receive your results only by: MyChart Message (if you have MyChart) OR A paper copy in the mail If you have any lab test that is abnormal or we need to change your treatment, we will call you to review the results.   Testing/Procedures: None   Follow-Up: At [facility], you and your health needs are our priority.  As part of our continuing mission to provide you with exceptional heart care, we have created designated Provider Care Teams.  These Care Teams include your primary Cardiologist (physician) and Advanced Practice Providers (APPs -  Physician Assistants and Nurse Practitioners) who all work together to provide you with the care you need, when you need it.  We recommend signing up for the patient portal called "MyChart".  Sign up information is provided on this After Visit Summary.  MyChart is used to connect with patients for Virtual Visits (Telemedicine).  Patients are able to view lab/test results, encounter notes, upcoming appointments, etc.  Non-urgent messages can be sent to your provider as well.   To learn more about what you can do with MyChart, go to [email or web address].    Your next appointment:   1 year(s)  The format for your next appointment:   In Person  Provider:   Norman Herrlich, MD    Other Instructions Stop Plavix 7 days prior to colonoscopy. Restart 1 day after colonoscopy.  Important Information About Sugar

## 2023-12-07 ENCOUNTER — Encounter: Payer: Self-pay | Admitting: Cardiology

## 2023-12-07 NOTE — Progress Notes (Signed)
Cardiology Office Note:    Date:  12/08/2023   ID:  JIN BAKALAR, DOB May 23, 1956, MRN [id number]  PCP:  Hadley Pen, MD  Cardiologist:  Norman Herrlich, MD    Referring MD: Hadley Pen, MD    ASSESSMENT:    1. Coronary artery disease of native artery of native heart with stable angina pectoris (HCC)   2. Essential hypertension   3. Dyslipidemia    PLAN:    In order of problems listed above:  Stable CAD having no angina after previous PCI and stent continue current treatment including clopidogrel multiple lipid-lowering medications and his beta-blocker. I am unsure how well his hypertension is controlled he will get a validated device good technique can be measurements over several weeks and for now continue his current combination beta-blocker thiazide diuretic amlodipine and lisinopril Good result LDL at target continue his current lipid-lowering treatment   Next appointment: 1 year   Medication Adjustments/Labs and Tests Ordered: Current medicines are reviewed at length with the patient today.  Concerns regarding medicines are outlined above.  Orders Placed This Encounter  Procedures   EKG 12-Lead   Meds ordered this encounter  Medications   atorvastatin (LIPITOR) 40 MG tablet    Sig: Take 1 tablet (40 mg total) by mouth daily.    Dispense:  90 tablet    Refill:  3   clopidogrel (PLAVIX) 75 MG tablet    Sig: Take 1 tablet (75 mg total) by mouth daily with breakfast.    Dispense:  90 tablet    Refill:  3    Pt needs appointment 1st attempt.   metFORMIN (GLUCOPHAGE) 1000 MG tablet    Sig: Take 1 tablet (1,000 mg total) by mouth 2 (two) times daily with a meal.    Dispense:  180 tablet    Refill:  3   nitroGLYCERIN (NITROSTAT) 0.4 MG SL tablet    Sig: Place 1 tablet (0.4 mg total) under the tongue every 5 (five) minutes as needed for chest pain (CALL 911 WITH NO RELIEF IN CHEST PAIN AFTER 3 PILLS.).    Dispense:  25 tablet    Refill:  3   DULoxetine  (CYMBALTA) 60 MG capsule    Sig: Take 1 capsule (60 mg total) by mouth daily.    Dispense:  90 capsule    Refill:  3     History of Present Illness:    Nicholas Joseph is a 68 y.o. male with a hx of CAD with PCI and drug-eluting stent to the LAD 07/10/2018 hypertension dyslipidemia  last seen 10/26/2022.  Compliance with diet, lifestyle and medications: Yes  Lipid profile 07/18/2023 cholesterol 103 LDL 37 non-HDL cholesterol 63  From a cardiology perspective he is doing well unfortunately is quite bothered by chronic and worsened back pain He has had no angina edema shortness of breath palpitation or syncope he takes\multiple lipid-lowering medications Lipitor and Zetia and fenofibrate without muscle pain or weakness  He checks home blood pressure but is using a wrist device To optimize blood pressure control he will purchase a new validated device good technique 2 measurements for several weeks and leave a list or send it through MyChart For now continue his current antihypertensives Past Medical History:  Diagnosis Date   Acute otitis media 06/27/2018   Allergic rhinitis 12/19/2019   Angina pectoris (HCC) 07/10/2018   Anxiety and depression 01/22/2016   B12 deficiency 07/28/2021   Benign non-nodular prostatic hyperplasia with lower urinary tract symptoms  01/22/2016   Chest pain 05/01/2018   Chest pain in adult 06/11/2018   Coronary artery disease    Coronary artery disease of native artery of native heart with stable angina pectoris (HCC)    Degenerative lumbar spinal stenosis 01/22/2016   Dizziness 12/19/2019   Dyslipidemia 01/22/2016   Dyspnea on exertion 10/04/2018   Encounter for Medicare annual wellness exam 08/20/2018   Gastroesophageal reflux disease without esophagitis 01/22/2016   History of kidney stones    Hypertension 01/22/2016   Hypomagnesemia 07/28/2021   Left lumbar radiculopathy 07/06/2021   Lichenoid actinic keratosis 12/20/2018   Low vitamin D level  07/15/2019   Memory problem 07/15/2019   Neck pain 07/17/2017   Overweight (BMI 25.0-29.9) 10/08/2018   Paresthesia and pain of both upper extremities 07/17/2017   Proteinuria 08/25/2018   Recurrent major depressive disorder, in partial remission (HCC) 07/15/2019   S/P angioplasty with stent 07/10/2018 07/11/2018   Squamous cell carcinoma in situ (SCCIS) of scalp 08/25/2018   Type 2 diabetes mellitus with hyperglycemia, with long-term current use of insulin (HCC) 01/22/2016   Type 2 diabetes mellitus without complication, without long-term current use of insulin (HCC) 01/22/2016   Vitamin D deficiency 07/28/2021   Wheezing on right side of chest on exhalation 07/17/2017    Current Medications: Current Meds  Medication Sig   Accu-Chek Softclix Lancets lancets 1 each by Other route 2 (two) times daily.   amLODipine (NORVASC) 10 MG tablet Take 10 mg by mouth daily.   atorvastatin (LIPITOR) 40 MG tablet Take 1 tablet (40 mg total) by mouth daily.   bisoprolol-hydrochlorothiazide (ZIAC) 5-6.25 MG tablet Take 1 tablet by mouth daily.    Blood Glucose Monitoring Suppl (GLUCOCOM BLOOD GLUCOSE MONITOR) DEVI See admin instructions.   carvedilol (COREG) 3.125 MG tablet Take 1 tablet by mouth daily.   clopidogrel (PLAVIX) 75 MG tablet Take 1 tablet (75 mg total) by mouth daily with breakfast.   cyanocobalamin (VITAMIN B12) 1000 MCG tablet Take 1 tablet by mouth daily.   dapagliflozin propanediol (FARXIGA) 10 MG TABS tablet Take 1 tablet by mouth daily.   DULoxetine (CYMBALTA) 60 MG capsule Take 1 capsule (60 mg total) by mouth daily.   esomeprazole (NEXIUM) 40 MG capsule Take 40 mg by mouth daily before breakfast.   ezetimibe (ZETIA) 10 MG tablet TAKE 1 TABLET (10 MG TOTAL) BY MOUTH DAILY. MUST SCHEDULE APPOINTMENT FOR FURTHER REFILLS   fenofibrate 160 MG tablet Take 160 mg by mouth daily.   fluticasone (FLONASE) 50 MCG/ACT nasal spray Place 2 sprays into both nostrils daily.    glimepiride  (AMARYL) 4 MG tablet Take 1 tablet by mouth daily.   glucose blood (ACCU-CHEK GUIDE TEST) test strip 1 each by Other route 2 (two) times daily.   insulin degludec (TRESIBA FLEXTOUCH) 100 UNIT/ML FlexTouch Pen Inject 30 units once a day with additional as needed. MDD: 50 units/day   Insulin Pen Needle (GLOBAL EASY GLIDE PEN NEEDLES) 32G X 4 MM MISC Use as needed for 1 injection daily   lisinopril (PRINIVIL,ZESTRIL) 40 MG tablet Take 40 mg by mouth daily.   loratadine (CLARITIN) 10 MG tablet Take 10 mg by mouth daily.   magnesium oxide (MAG-OX) 400 MG tablet Take 400 mg by mouth 2 (two) times daily.   meloxicam (MOBIC) 15 MG tablet Take 1 tablet by mouth daily.   metFORMIN (GLUCOPHAGE) 1000 MG tablet Take 1 tablet (1,000 mg total) by mouth 2 (two) times daily with a meal.   nitroGLYCERIN (NITROSTAT)  0.4 MG SL tablet Place 1 tablet (0.4 mg total) under the tongue every 5 (five) minutes as needed for chest pain (CALL 911 WITH NO RELIEF IN CHEST PAIN AFTER 3 PILLS.).   tiZANidine (ZANAFLEX) 4 MG tablet Take 1 tablet by mouth every 8 (eight) hours as needed.   [DISCONTINUED] atorvastatin (LIPITOR) 40 MG tablet Take 40 mg by mouth daily.   [DISCONTINUED] clopidogrel (PLAVIX) 75 MG tablet TAKE 1 TABLET (75 MG TOTAL) BY MOUTH DAILY WITH BREAKFAST.   [DISCONTINUED] DULoxetine (CYMBALTA) 60 MG capsule Take 60 mg by mouth daily.   [DISCONTINUED] metFORMIN (GLUCOPHAGE) 1000 MG tablet Take 1,000 mg by mouth 2 (two) times daily with a meal.       EKGs/Labs/Other Studies Reviewed:    The following studies were reviewed today:  Cardiac Studies & Procedures   CARDIAC CATHETERIZATION  CARDIAC CATHETERIZATION 07/10/2018  Narrative  Mid LAD lesion is 75% stenosed.  A drug-eluting stent was successfully placed using a STENT SYNERGY DES 3.5X20.  Post intervention, there is a 0% residual stenosis.  The left ventricular systolic function is normal.  LV end diastolic pressure is normal. LVEDP 6 mm Hg.   The left ventricular ejection fraction is 55-65% by visual estimate.  There is no aortic valve stenosis.    Recommend uninterrupted dual antiplatelet therapy with Aspirin 81mg  daily and Clopidogrel 75mg  daily for a minimum of 6 months (stable ischemic heart disease - Class I recommendation).  If clopidogrel has to be stopped sooner for surgery, this could be safely stopped after 1 month since a Synergy stent was used.  Findings Coronary Findings Diagnostic  Dominance: Co-dominant  Left Anterior Descending Mid LAD lesion is 75% stenosed. The lesion is eccentric.  Right Coronary Artery Vessel is small.  Intervention  Mid LAD lesion Stent Lesion crossed with guidewire using a WIRE ASAHI PROWATER 180CM. Pre-stent angioplasty was not performed. A drug-eluting stent was successfully placed using a STENT SYNERGY DES 3.5X20. Stent strut is well apposed. Post-stent angioplasty was performed using a BALLOON [place] EMERGE MR [id number]. Post-Intervention Lesion Assessment The intervention was successful. Pre-interventional TIMI flow is 3. Post-intervention TIMI flow is 3. No complications occurred at this lesion. There is a 0% residual stenosis post intervention.   STRESS TESTS  MYOCARDIAL PERFUSION IMAGING 12/15/2018     CT SCANS  CT CORONARY FRACTIONAL FLOW RESERVE DATA PREP 07/03/2018  Narrative EXAM: FF/RCT ANALYSIS  CLINICAL DATA:  68 year old male with chest pain.  FINDINGS: FFRct analysis was performed on the original cardiac CT angiogram dataset. Diagrammatic representation of the FFRct analysis is provided in a separate PDF document in PACS. This dictation was created using the PDF document and an interactive 3D model of the results. 3D model is not available in the EMR/PACS. Normal FFR range is >0.80.  1. Left Main:  No significant stenosis.  2. LAD: Proximal LAD CT FFR: 0.95, mid LAD CT FFR 0.65. 3. LCX: No significant stenosis. 4. RCA: No significant  stenosis.  IMPRESSION: 1. CT FFR analysis showed severe stenosis in the proximal LAD. A left cardiac catheterization is recommended.   Electronically Signed By: Tobias Alexander On: 07/03/2018 15:04   CT CORONARY MORPH W/CTA COR W/SCORE 07/03/2018  Addendum 07/03/2018 10:07 AM ADDENDUM REPORT: 07/03/2018 10:05  CLINICAL DATA:  30 -year-old male with DM, HTN, HLP, DOE and chest pain.  EXAM: Cardiac/Coronary  CT  TECHNIQUE: The patient was scanned on a [employer].  FINDINGS: A 120 kV prospective scan was triggered in the descending  thoracic aorta at 111 HU's. Axial non-contrast 3 mm slices were carried out through the heart. The data set was analyzed on a dedicated work station and scored using the Agatson method. Gantry rotation speed was 250 msecs and collimation was .6 mm. No beta blockade and 0.8 mg of sl NTG was given. The 3D data set was reconstructed in 5% intervals of the 67-82 % of the R-R cycle. Diastolic phases were analyzed on a dedicated work station using MPR, MIP and VRT modes. The patient received 80 cc of contrast.  Aorta: Normal size. No calcifications, minimal atheroma. No dissection.  Aortic Valve:  Trileaflet.  Trivial calcifications.  Coronary Arteries:  Normal coronary origin.  Left dominance.  Left main is a large artery that gives rise to LAD and LCX arteries. Left main has minimal mixed plaque with stenosis 0-25%.  LAD is a large vessel that gives rise to one small diagonal artery and wraps around the apex. Ostial/proximal LAD has mild diffuse calcified plaque with stenosis 25-50%. This is followed by a moderate non-calcified plaque with negative remodeling and stenosis 50-69% but possibly > 70%. Mid and distal LAD have minimal plaque.  LCX is a large dominant artery that gives rise to two large OM branches, PDA and PLVB. There is mild long calcified plaque in the proximal and mid LCX artery with associated stenosis  25-50%.  OM1 has a minimal calcified plaque in the proximal segment with stenosis 0-25%,  OM2 has no significant plaque.  PDA and PLVB have no significant plaque.  RCA is a small non-dominant artery that has minimal plaque.  Other findings:  Normal pulmonary vein drainage into the left atrium.  Normal let atrial appendage without a thrombus.  Normal size of the pulmonary artery.  IMPRESSION: 1. Coronary calcium score of 383. This was 48 percentile for age and sex matched control.  2. Normal coronary origin with left dominance.  3. At least moderate stenosis in the proximal LAD. Additional analysis with CT FFR will be submitted.   Electronically Signed By: Tobias Alexander On: 07/03/2018 10:05  Narrative EXAM: OVER-READ INTERPRETATION  CT CHEST  The following report is an over-read performed by radiologist Dr. Charlett Nose of [facility] on 07/03/2018. This over-read does not include interpretation of cardiac or coronary anatomy or pathology. The coronary CTA interpretation by the cardiologist is attached.  COMPARISON:  None.  FINDINGS: Vascular: Heart is normal size.  Visualized aorta is normal caliber.  Mediastinum/Nodes: No adenopathy in the lower mediastinum or hila.  Lungs/Pleura: Visualized lungs clear.  No effusions.  Upper Abdomen: Imaging into the upper abdomen shows no acute findings.  Musculoskeletal: Chest wall soft tissues are unremarkable. No acute bony abnormality.  IMPRESSION: No acute or significant extracardiac abnormality.  Electronically Signed: By: Charlett Nose M.D. On: 07/03/2018 09:33         EKG Interpretation Date/Time:  Friday December 08 2023 08:05:32 EST Ventricular Rate:  88 PR Interval:  166 QRS Duration:  96 QT Interval:  360 QTC Calculation: 435 R Axis:   83  Text Interpretation: Normal sinus rhythm Normal ECG When compared with ECG of 11-Jul-2018 04:55, No significant change was found Confirmed by  Norman Herrlich ([id number]) on 12/08/2023 8:10:20 AM     Physical Exam:    VS:  BP (!) 144/82   Pulse 88   Ht 5\' 6"  (1.676 m)   Wt 168 lb (76.2 kg)   SpO2 95%   BMI 27.12 kg/m  Wt Readings from Last 3 Encounters:  12/08/23 168 lb (76.2 kg)  10/26/22 170 lb (77.1 kg)  07/21/21 162 lb 0.6 oz (73.5 kg)     GEN:  Well nourished, well developed in no acute distress HEENT: Normal NECK: No JVD; No carotid bruits LYMPHATICS: No lymphadenopathy CARDIAC: RRR, no murmurs, rubs, gallops RESPIRATORY:  Clear to auscultation without rales, wheezing or rhonchi  ABDOMEN: Soft, non-tender, non-distended MUSCULOSKELETAL:  No edema; No deformity  SKIN: Warm and dry NEUROLOGIC:  Alert and oriented x 3 PSYCHIATRIC:  Normal affect    Signed, Norman Herrlich, MD  12/08/2023 8:23 AM    [facility]

## 2023-12-08 ENCOUNTER — Encounter: Payer: Self-pay | Admitting: Cardiology

## 2023-12-08 ENCOUNTER — Ambulatory Visit: Payer: Medicare HMO | Attending: Cardiology | Admitting: Cardiology

## 2023-12-08 VITALS — BP 144/82 | HR 88 | Ht 66.0 in | Wt 168.0 lb

## 2023-12-08 DIAGNOSIS — I25118 Atherosclerotic heart disease of native coronary artery with other forms of angina pectoris: Secondary | ICD-10-CM | POA: Diagnosis not present

## 2023-12-08 DIAGNOSIS — I10 Essential (primary) hypertension: Secondary | ICD-10-CM | POA: Diagnosis not present

## 2023-12-08 DIAGNOSIS — E785 Hyperlipidemia, unspecified: Secondary | ICD-10-CM

## 2023-12-08 MED ORDER — CLOPIDOGREL BISULFATE 75 MG PO TABS: 75.0000 mg | ORAL_TABLET | Freq: Every day | ORAL | 3 refills | Status: AC

## 2023-12-08 MED ORDER — ATORVASTATIN CALCIUM 40 MG PO TABS: 40.0000 mg | ORAL_TABLET | Freq: Every day | ORAL | 3 refills | Status: AC

## 2023-12-08 MED ORDER — METFORMIN HCL 1000 MG PO TABS: 1000.0000 mg | ORAL_TABLET | Freq: Two times a day (BID) | ORAL | 3 refills | Status: AC

## 2023-12-08 MED ORDER — NITROGLYCERIN 0.4 MG SL SUBL: 0.4000 mg | SUBLINGUAL_TABLET | SUBLINGUAL | 3 refills | Status: DC | PRN

## 2023-12-08 MED ORDER — DULOXETINE HCL 60 MG PO CPEP: 60.0000 mg | ORAL_CAPSULE | Freq: Every day | ORAL | 3 refills | Status: AC

## 2023-12-08 MED ORDER — EZETIMIBE 10 MG PO TABS: 10.0000 mg | ORAL_TABLET | Freq: Every day | ORAL | 3 refills | Status: DC

## 2023-12-08 NOTE — Patient Instructions (Addendum)
Medication Instructions:  Your physician recommends that you continue on your current medications as directed. Please refer to the Current Medication list given to you today.  *If you need a refill on your cardiac medications before your next appointment, please call your pharmacy*   Lab Work: None If you have labs (blood work) drawn today and your tests are completely normal, you will receive your results only by: MyChart Message (if you have MyChart) OR A paper copy in the mail If you have any lab test that is abnormal or we need to change your treatment, we will call you to review the results.   Testing/Procedures: None   Follow-Up: At [facility], you and your health needs are our priority.  As part of our continuing mission to provide you with exceptional heart care, we have created designated Provider Care Teams.  These Care Teams include your primary Cardiologist (physician) and Advanced Practice Providers (APPs -  Physician Assistants and Nurse Practitioners) who all work together to provide you with the care you need, when you need it.  We recommend signing up for the patient portal called "MyChart".  Sign up information is provided on this After Visit Summary.  MyChart is used to connect with patients for Virtual Visits (Telemedicine).  Patients are able to view lab/test results, encounter notes, upcoming appointments, etc.  Non-urgent messages can be sent to your provider as well.   To learn more about what you can do with MyChart, go to [email or web address].    Your next appointment:   1 year(s)  Provider:   Norman Herrlich, MD    Other Instructions Purchase an Omron blood pressure device.  Check your blood pressure daily for 2 weeks and send a list of blood pressures to the office in 2 weeks.          [employer]  Tips to measure your blood pressure correctly  To determine whether you have hypertension, a medical professional will take a blood  pressure reading. How you prepare for the test, the position of your arm, and other factors can change a blood pressure reading by 10% or more. That could be enough to hide high blood pressure, start you on a drug you don't really need, or lead your doctor to incorrectly adjust your medications. National and international guidelines offer specific instructions for measuring blood pressure. If a doctor, nurse, or medical assistant isn't doing it right, don't hesitate to ask him or her to get with the guidelines. Here's what you can do to ensure a correct reading:  Don't drink a caffeinated beverage or smoke during the 30 minutes before the test.  Sit quietly for five minutes before the test begins.  During the measurement, sit in a chair with your feet on the floor and your arm supported so your elbow is at about heart level.  The inflatable part of the cuff should completely cover at least 80% of your upper arm, and the cuff should be placed on bare skin, not over a shirt.  Don't talk during the measurement.  Have your blood pressure measured twice, with a brief break in between. If the readings are different by 5 points or more, have it done a third time. There are times to break these rules. If you sometimes feel lightheaded when getting out of bed in the morning or when you stand after sitting, you should have your blood pressure checked while seated and then while standing to see if it falls from one position  to the next. Because blood pressure varies throughout the day, your doctor will rarely diagnose hypertension on the basis of a single reading. Instead, he or she will want to confirm the measurements on at least two occasions, usually within a few weeks of one another. The exception to this rule is if you have a blood pressure reading of 180/110 mm Hg or higher. A result this high usually calls for prompt treatment. It's also a good idea to have your blood pressure measured in both arms at least  once, since the reading in one arm (usually the right) may be higher than that in the left. A 2014 study in The American Journal of Medicine of nearly 3,400 people found average arm- to-arm differences in systolic blood pressure of about 5 points. The higher number should be used to make treatment decisions. In 2017, new guidelines from the American Heart Association, the [employer] of Cardiology, and nine other health organizations lowered the diagnosis of high blood pressure to 130/80 mm Hg or higher for all adults. The guidelines also redefined the various blood pressure categories to now include normal, elevated, Stage 1 hypertension, Stage 2 hypertension, and hypertensive crisis (see "Blood pressure categories"). Blood pressure categories  Blood pressure category SYSTOLIC (upper number)  DIASTOLIC (lower number)  Normal Less than 120 mm Hg and Less than 80 mm Hg  Elevated 120-129 mm Hg and Less than 80 mm Hg  High blood pressure: Stage 1 hypertension 130-139 mm Hg or 80-89 mm Hg  High blood pressure: Stage 2 hypertension 140 mm Hg or higher or 90 mm Hg or higher  Hypertensive crisis (consult your doctor immediately) Higher than 180 mm Hg and/or Higher than 120 mm Hg  Source: American Heart Association and American Stroke Association. For more on getting your blood pressure under control, buy Controlling Your Blood Pressure, a Special Health Report from [facility].

## 2024-03-08 ENCOUNTER — Telehealth: Payer: Self-pay | Admitting: Cardiology

## 2024-03-08 NOTE — Telephone Encounter (Signed)
 Patient has a colonoscopy on Monday and has some questions about taking his BP medication. If someone could give him a call back at [phone]. He said if he doesn't answer just to leave him a VM.

## 2024-03-08 NOTE — Telephone Encounter (Signed)
 Spoke with pt who states that he had answers regarding his medication and his coloscopy. Pt advised to keep a BP/HR log and let us  know if his readings are staying elevated 1-2 hours after his mediation. Pt verbalized understanding and had no additional questions.

## 2024-03-08 NOTE — Telephone Encounter (Signed)
 Left vm to return call.

## 2024-11-13 ENCOUNTER — Other Ambulatory Visit: Payer: Self-pay | Admitting: Cardiology

## 2024-12-05 ENCOUNTER — Other Ambulatory Visit: Payer: Self-pay

## 2024-12-09 NOTE — Progress Notes (Unsigned)
 " Cardiology Office Note:    Date:  12/11/2024   ID:  Nicholas Joseph, DOB 1956-04-02, MRN [id number]  PCP:  Silver Lamar LABOR, MD  Cardiologist:  Redell Leiter, MD    Referring MD: Silver Lamar LABOR, MD    ASSESSMENT:    1. Coronary artery disease of native artery of native heart with stable angina pectoris   2. Essential hypertension   3. Dyslipidemia    PLAN:    In order of problems listed above:  Stable CAD continue medical therapy including beta-blocker and combined lipid-lowering with statin ezetimibe  and fenofibrate  lipids are at target BP is not at continue amlodipine  beta-blocker thiazide switch ACE to ARB reassess in 2 weeks and if not at target add ARB and screen for renal artery stenosis and excess aldosterone at that point Good control of lipids labs following his PCP office   Next appointment: 1 year   Medication Adjustments/Labs and Tests Ordered: Current medicines are reviewed at length with the patient today.  Concerns regarding medicines are outlined above.  Orders Placed This Encounter  Procedures   EKG 12-Lead   No orders of the defined types were placed in this encounter.    History of Present Illness:    Nicholas Joseph is a 69 y.o. male with a hx of CAD with PCI and stent LAD 07/10/2018 hypertension and dyslipidemia last seen 12/08/2023.  Lipid profile 08/01/2024 cholesterol 131 LDL 58 non-HDL cholesterol 89 triglycerides 370 CMP at that time creatinine 0.95 GFR 87 cc/min normal liver function test potassium 4.1 sodium 140.  Compliance with diet, lifestyle and medications: Yes  His home blood pressure is not well-controlled he tells me his systolics in general in the range of 155 diastolic 80 similar to the office today He is not having chest pain shortness of breath palpitation or syncope He is taking beta-blocker thiazide diuretic ACE and calcium  channel blocker I asked him to discontinue lisinopril switch to a more potent ARB valsartan  320 mg  daily give me a list of ambulatory blood pressures in 2 weeks.  If BP remains elevated beyond target add ARB and screen for renal artery stenosis He tolerates lipid-lowering therapy and takes fenofibrate  and atorvastatin  and Zetia .  He is having no muscle pain or weakness Past Medical History:  Diagnosis Date   Acute otitis media 06/27/2018   Allergic rhinitis 12/19/2019   Angina pectoris 07/10/2018   Anxiety and depression 01/22/2016   B12 deficiency 07/28/2021   Benign non-nodular prostatic hyperplasia with lower urinary tract symptoms 01/22/2016   Chest pain 05/01/2018   Chest pain in adult 06/11/2018   Coronary artery disease    Coronary artery disease of native artery of native heart with stable angina pectoris    Degenerative lumbar spinal stenosis 01/22/2016   Dizziness 12/19/2019   Dyslipidemia 01/22/2016   Dyspnea on exertion 10/04/2018   Encounter for Medicare annual wellness exam 08/20/2018   Gastroesophageal reflux disease without esophagitis 01/22/2016   History of kidney stones    Hypertension 01/22/2016   Hypomagnesemia 07/28/2021   Left lumbar radiculopathy 07/06/2021   Lichenoid actinic keratosis 12/20/2018   Low vitamin D level 07/15/2019   Memory problem 07/15/2019   Neck pain 07/17/2017   Overweight (BMI 25.0-29.9) 10/08/2018   Paresthesia and pain of both upper extremities 07/17/2017   Proteinuria 08/25/2018   Recurrent major depressive disorder, in partial remission 07/15/2019   S/P angioplasty with stent 07/10/2018 07/11/2018   Squamous cell carcinoma in situ (SCCIS) of scalp 08/25/2018  Type 2 diabetes mellitus with hyperglycemia, with long-term current use of insulin  (HCC) 01/22/2016   Type 2 diabetes mellitus without complication, without long-term current use of insulin  (HCC) 01/22/2016   Vitamin D deficiency 07/28/2021   Wheezing on right side of chest on exhalation 07/17/2017    Current Medications: Active Medications[1]    EKGs/Labs/Other  Studies Reviewed:    The following studies were reviewed today:  Cardiac Studies & Procedures   ______________________________________________________________________________________________ CARDIAC CATHETERIZATION  CARDIAC CATHETERIZATION 07/10/2018  Conclusion  Mid LAD lesion is 75% stenosed.  A drug-eluting stent was successfully placed using a STENT SYNERGY DES 3.5X20.  Post intervention, there is a 0% residual stenosis.  The left ventricular systolic function is normal.  LV end diastolic pressure is normal. LVEDP 6 mm Hg.  The left ventricular ejection fraction is 55-65% by visual estimate.  There is no aortic valve stenosis.    Recommend uninterrupted dual antiplatelet therapy with Aspirin  81mg  daily and Clopidogrel  75mg  daily for a minimum of 6 months (stable ischemic heart disease - Class I recommendation).  If clopidogrel  has to be stopped sooner for surgery, this could be safely stopped after 1 month since a Synergy stent was used.  Findings Coronary Findings Diagnostic  Dominance: Co-dominant  Left Anterior Descending Mid LAD lesion is 75% stenosed. The lesion is eccentric.  Right Coronary Artery Vessel is small.  Intervention  Mid LAD lesion Stent Lesion crossed with guidewire using a WIRE ASAHI PROWATER 180CM. Pre-stent angioplasty was not performed. A drug-eluting stent was successfully placed using a STENT SYNERGY DES 3.5X20. Stent strut is well apposed. Post-stent angioplasty was performed using a BALLOON [place] EMERGE MR [id number]. Post-Intervention Lesion Assessment The intervention was successful. Pre-interventional TIMI flow is 3. Post-intervention TIMI flow is 3. No complications occurred at this lesion. There is a 0% residual stenosis post intervention.   STRESS TESTS  MYOCARDIAL PERFUSION IMAGING 12/15/2018        CT SCANS  CT CORONARY FRACTIONAL FLOW RESERVE DATA PREP 07/03/2018  Narrative EXAM: FF/RCT ANALYSIS  CLINICAL DATA:   69 year old male with chest pain.  FINDINGS: FFRct analysis was performed on the original cardiac CT angiogram dataset. Diagrammatic representation of the FFRct analysis is provided in a separate PDF document in PACS. This dictation was created using the PDF document and an interactive 3D model of the results. 3D model is not available in the EMR/PACS. Normal FFR range is >0.80.  1. Left Main:  No significant stenosis.  2. LAD: Proximal LAD CT FFR: 0.95, mid LAD CT FFR 0.65. 3. LCX: No significant stenosis. 4. RCA: No significant stenosis.  IMPRESSION: 1. CT FFR analysis showed severe stenosis in the proximal LAD. A left cardiac catheterization is recommended.   Electronically Signed By: Leim Moose On: 07/03/2018 15:04   CT CORONARY MORPH W/CTA COR W/SCORE 07/03/2018  Addendum 07/03/2018 10:07 AM ADDENDUM REPORT: 07/03/2018 10:05  CLINICAL DATA:  65 -year-old male with DM, HTN, HLP, DOE and chest pain.  EXAM: Cardiac/Coronary  CT  TECHNIQUE: The patient was scanned on a [employer].  FINDINGS: A 120 kV prospective scan was triggered in the descending thoracic aorta at 111 HU's. Axial non-contrast 3 mm slices were carried out through the heart. The data set was analyzed on a dedicated work station and scored using the Agatson method. Gantry rotation speed was 250 msecs and collimation was .6 mm. No beta blockade and 0.8 mg of sl NTG was given. The 3D data set was reconstructed in 5% intervals  of the 67-82 % of the R-R cycle. Diastolic phases were analyzed on a dedicated work station using MPR, MIP and VRT modes. The patient received 80 cc of contrast.  Aorta: Normal size. No calcifications, minimal atheroma. No dissection.  Aortic Valve:  Trileaflet.  Trivial calcifications.  Coronary Arteries:  Normal coronary origin.  Left dominance.  Left main is a large artery that gives rise to LAD and LCX arteries. Left main has minimal mixed plaque with  stenosis 0-25%.  LAD is a large vessel that gives rise to one small diagonal artery and wraps around the apex. Ostial/proximal LAD has mild diffuse calcified plaque with stenosis 25-50%. This is followed by a moderate non-calcified plaque with negative remodeling and stenosis 50-69% but possibly > 70%. Mid and distal LAD have minimal plaque.  LCX is a large dominant artery that gives rise to two large OM branches, PDA and PLVB. There is mild long calcified plaque in the proximal and mid LCX artery with associated stenosis 25-50%.  OM1 has a minimal calcified plaque in the proximal segment with stenosis 0-25%,  OM2 has no significant plaque.  PDA and PLVB have no significant plaque.  RCA is a small non-dominant artery that has minimal plaque.  Other findings:  Normal pulmonary vein drainage into the left atrium.  Normal let atrial appendage without a thrombus.  Normal size of the pulmonary artery.  IMPRESSION: 1. Coronary calcium  score of 383. This was 102 percentile for age and sex matched control.  2. Normal coronary origin with left dominance.  3. At least moderate stenosis in the proximal LAD. Additional analysis with CT FFR will be submitted.   Electronically Signed By: Leim Moose On: 07/03/2018 10:05  Narrative EXAM: OVER-READ INTERPRETATION  CT CHEST  The following report is an over-read performed by radiologist Dr. Franky Crease of [facility] on 07/03/2018. This over-read does not include interpretation of cardiac or coronary anatomy or pathology. The coronary CTA interpretation by the cardiologist is attached.  COMPARISON:  None.  FINDINGS: Vascular: Heart is normal size.  Visualized aorta is normal caliber.  Mediastinum/Nodes: No adenopathy in the lower mediastinum or hila.  Lungs/Pleura: Visualized lungs clear.  No effusions.  Upper Abdomen: Imaging into the upper abdomen shows no acute findings.  Musculoskeletal: Chest wall  soft tissues are unremarkable. No acute bony abnormality.  IMPRESSION: No acute or significant extracardiac abnormality.  Electronically Signed: By: Franky Crease M.D. On: 07/03/2018 09:33     ______________________________________________________________________________________________      EKG Interpretation Date/Time:  Wednesday December 11 2024 14:49:22 EST Ventricular Rate:  92 PR Interval:  162 QRS Duration:  90 QT Interval:  352 QTC Calculation: 435 R Axis:   82  Text Interpretation: Normal sinus rhythm T wave abnormality, consider inferior ischemia When compared with ECG of 08-Dec-2023 08:05, No significant change was found Confirmed by Monetta Rogue ([id number]) on 12/11/2024 2:54:04 PM    Physical Exam:    VS:  BP (!) 160/66   Pulse 92   Ht 5' 6 (1.676 m)   Wt 168 lb 12.8 oz (76.6 kg)   SpO2 99%   BMI 27.25 kg/m     Wt Readings from Last 3 Encounters:  12/11/24 168 lb 12.8 oz (76.6 kg)  12/08/23 168 lb (76.2 kg)  10/26/22 170 lb (77.1 kg)     GEN:  Well nourished, well developed in no acute distress HEENT: Normal NECK: No JVD; No carotid bruits LYMPHATICS: No lymphadenopathy CARDIAC: RRR, no murmurs, rubs,  gallops RESPIRATORY:  Clear to auscultation without rales, wheezing or rhonchi  ABDOMEN: Soft, non-tender, non-distended MUSCULOSKELETAL:  No edema; No deformity  SKIN: Warm and dry NEUROLOGIC:  Alert and oriented x 3 PSYCHIATRIC:  Normal affect    Signed, Redell Leiter, MD  12/11/2024 3:07 PM    [facility]      [1]  Current Meds  Medication Sig   Accu-Chek Softclix Lancets lancets 1 each by Other route 2 (two) times daily.   amLODipine  (NORVASC ) 10 MG tablet Take 10 mg by mouth daily.   atorvastatin  (LIPITOR) 40 MG tablet Take 1 tablet (40 mg total) by mouth daily.   bisoprolol -hydrochlorothiazide  (ZIAC ) 5-6.25 MG tablet Take 1 tablet by mouth daily.    Blood Glucose Monitoring Suppl (GLUCOCOM BLOOD GLUCOSE MONITOR)  DEVI See admin instructions.   clopidogrel  (PLAVIX ) 75 MG tablet Take 1 tablet (75 mg total) by mouth daily with breakfast.   cyanocobalamin (VITAMIN B12) 1000 MCG tablet Take 1 tablet by mouth daily.   DULoxetine  (CYMBALTA ) 60 MG capsule Take 1 capsule (60 mg total) by mouth daily.   esomeprazole (NEXIUM) 40 MG capsule Take 40 mg by mouth daily before breakfast.   ezetimibe  (ZETIA ) 10 MG tablet Take 1 tablet (10 mg total) by mouth daily.   fenofibrate  160 MG tablet Take 160 mg by mouth daily.   fluticasone  (FLONASE ) 50 MCG/ACT nasal spray Place 2 sprays into both nostrils daily.    glimepiride (AMARYL) 4 MG tablet Take 1 tablet by mouth daily.   glucose blood (ACCU-CHEK GUIDE TEST) test strip 1 each by Other route 2 (two) times daily.   insulin  degludec (TRESIBA FLEXTOUCH) 100 UNIT/ML FlexTouch Pen Inject 30 units once a day with additional as needed. MDD: 50 units/day   Insulin  Pen Needle (GLOBAL EASY GLIDE PEN NEEDLES) 32G X 4 MM MISC Use as needed for 1 injection daily   lisinopril (PRINIVIL,ZESTRIL) 40 MG tablet Take 40 mg by mouth daily.   loratadine  (CLARITIN ) 10 MG tablet Take 10 mg by mouth daily.   magnesium oxide (MAG-OX) 400 (240 Mg) MG tablet Take 1 tablet by mouth 2 (two) times daily.   meloxicam (MOBIC) 15 MG tablet Take 1 tablet by mouth daily.   metFORMIN  (GLUCOPHAGE ) 1000 MG tablet Take 1 tablet (1,000 mg total) by mouth 2 (two) times daily with a meal.   nitroGLYCERIN  (NITROSTAT ) 0.4 MG SL tablet Place 0.4 mg under the tongue every 5 (five) minutes as needed.   tiZANidine (ZANAFLEX) 4 MG tablet Take 1 tablet by mouth every 8 (eight) hours as needed.   [DISCONTINUED] dapagliflozin propanediol (FARXIGA) 10 MG TABS tablet Take 1 tablet by mouth daily.   "

## 2024-12-11 ENCOUNTER — Encounter: Payer: Self-pay | Admitting: Cardiology

## 2024-12-11 ENCOUNTER — Ambulatory Visit: Attending: Cardiology | Admitting: Cardiology

## 2024-12-11 VITALS — BP 160/66 | HR 92 | Ht 66.0 in | Wt 168.8 lb

## 2024-12-11 DIAGNOSIS — E785 Hyperlipidemia, unspecified: Secondary | ICD-10-CM | POA: Diagnosis not present

## 2024-12-11 DIAGNOSIS — I25118 Atherosclerotic heart disease of native coronary artery with other forms of angina pectoris: Secondary | ICD-10-CM

## 2024-12-11 DIAGNOSIS — I10 Essential (primary) hypertension: Secondary | ICD-10-CM

## 2024-12-11 MED ORDER — EZETIMIBE 10 MG PO TABS: 10.0000 mg | ORAL_TABLET | Freq: Every day | ORAL | 3 refills | Status: AC

## 2024-12-11 MED ORDER — VALSARTAN 320 MG PO TABS: 320.0000 mg | ORAL_TABLET | Freq: Every day | ORAL | 3 refills | Status: AC

## 2024-12-11 NOTE — Patient Instructions (Signed)
 Medication Instructions:  Your physician has recommended you make the following change in your medication:   STOP: Lisinopril START: Diovan  320 mg daily  *If you need a refill on your cardiac medications before your next appointment, please call your pharmacy*  Lab Work: None If you have labs (blood work) drawn today and your tests are completely normal, you will receive your results only by: MyChart Message (if you have MyChart) OR A paper copy in the mail If you have any lab test that is abnormal or we need to change your treatment, we will call you to review the results.  Testing/Procedures: None  Follow-Up: At [facility], you and your health needs are our priority.  As part of our continuing mission to provide you with exceptional heart care, our providers are all part of one team.  This team includes your primary Cardiologist (physician) and Advanced Practice Providers or APPs (Physician Assistants and Nurse Practitioners) who all work together to provide you with the care you need, when you need it.  Your next appointment:   1 year(s)  Provider:   Redell Leiter, MD    We recommend signing up for the patient portal called MyChart.  Sign up information is provided on this After Visit Summary.  MyChart is used to connect with patients for Virtual Visits (Telemedicine).  Patients are able to view lab/test results, encounter notes, upcoming appointments, etc.  Non-urgent messages can be sent to your provider as well.   To learn more about what you can do with MyChart, go to [email or web address].   Other Instructions Please keep a BP log for 2 weeks and send by MyChart or mail.                      Dr. Leiter [address] [place], [place] [address]  Blood Pressure Record Sheet To take your blood pressure, you will need a blood pressure machine. You can buy a blood pressure machine (blood pressure monitor) at your clinic, drug store, or online. When choosing one,  consider: An automatic monitor that has an arm cuff. A cuff that wraps snugly around your upper arm. You should be able to fit only one finger between your arm and the cuff. A device that stores blood pressure reading results. Do not choose a monitor that measures your blood pressure from your wrist or finger. Follow your health care provider's instructions for how to take your blood pressure. To use this form: Get one reading in the morning (a.m.) 1-2 hours after you take any medicines. Get one reading in the evening (p.m.) before supper.   Blood pressure log Date: _______________________  a.m. _____________________(1st reading) HR___________            p.m. _____________________(2nd reading) HR__________  Date: _______________________  a.m. _____________________(1st reading) HR___________            p.m. _____________________(2nd reading) HR__________  Date: _______________________  a.m. _____________________(1st reading) HR___________            p.m. _____________________(2nd reading) HR__________  Date: _______________________  a.m. _____________________(1st reading) HR___________            p.m. _____________________(2nd reading) HR__________  Date: _______________________  a.m. _____________________(1st reading) HR___________            p.m. _____________________(2nd reading) HR__________  Date: _______________________  a.m. _____________________(1st reading) HR___________            p.m. _____________________(2nd reading) HR__________  Date: _______________________  a.m. _____________________(1st reading) HR___________  p.m. _____________________(2nd reading) HR__________   This information is not intended to replace advice given to you by your health care provider. Make sure you discuss any questions you have with your health care provider. Document Revised: 02/26/2020 Document Reviewed: 02/26/2020 Elsevier Patient Education  2021 [employer].
